# Patient Record
Sex: Female | Born: 1950 | Race: White | Hispanic: No | Marital: Married | Smoking: Former smoker
Health system: Southern US, Community
[De-identification: ages and names within clinical notes are randomized; demographics above are authoritative.]

## PROBLEM LIST (undated history)

## (undated) DIAGNOSIS — M5136 Other intervertebral disc degeneration, lumbar region: Secondary | ICD-10-CM

## (undated) DIAGNOSIS — IMO0002 Reserved for concepts with insufficient information to code with codable children: Secondary | ICD-10-CM

## (undated) DIAGNOSIS — G43909 Migraine, unspecified, not intractable, without status migrainosus: Secondary | ICD-10-CM

## (undated) DIAGNOSIS — G43709 Chronic migraine without aura, not intractable, without status migrainosus: Secondary | ICD-10-CM

## (undated) DIAGNOSIS — Z973 Presence of spectacles and contact lenses: Secondary | ICD-10-CM

## (undated) DIAGNOSIS — M1990 Unspecified osteoarthritis, unspecified site: Secondary | ICD-10-CM

## (undated) DIAGNOSIS — N840 Polyp of corpus uteri: Secondary | ICD-10-CM

## (undated) DIAGNOSIS — K5900 Constipation, unspecified: Secondary | ICD-10-CM

## (undated) DIAGNOSIS — M719 Bursopathy, unspecified: Secondary | ICD-10-CM

## (undated) DIAGNOSIS — K7689 Other specified diseases of liver: Secondary | ICD-10-CM

## (undated) DIAGNOSIS — F419 Anxiety disorder, unspecified: Secondary | ICD-10-CM

## (undated) DIAGNOSIS — I82409 Acute embolism and thrombosis of unspecified deep veins of unspecified lower extremity: Secondary | ICD-10-CM

## (undated) DIAGNOSIS — K5909 Other constipation: Secondary | ICD-10-CM

## (undated) DIAGNOSIS — S62102A Fracture of unspecified carpal bone, left wrist, initial encounter for closed fracture: Secondary | ICD-10-CM

## (undated) DIAGNOSIS — M47816 Spondylosis without myelopathy or radiculopathy, lumbar region: Secondary | ICD-10-CM

## (undated) DIAGNOSIS — S8010XA Contusion of unspecified lower leg, initial encounter: Secondary | ICD-10-CM

## (undated) DIAGNOSIS — J3089 Other allergic rhinitis: Secondary | ICD-10-CM

## (undated) DIAGNOSIS — D696 Thrombocytopenia, unspecified: Principal | ICD-10-CM

## (undated) HISTORY — PX: ANAL FISSURE REPAIR: SHX2312

## (undated) HISTORY — PX: CATARACT EXTRACTION, BILATERAL: SHX1313

## (undated) HISTORY — PX: BUNIONECTOMY: SHX129

## (undated) HISTORY — DX: Thrombocytopenia, unspecified: D69.6

## (undated) HISTORY — PX: COLONOSCOPY: SHX174

## (undated) HISTORY — DX: Bursopathy, unspecified: M71.9

## (undated) HISTORY — PX: BLEPHAROPLASTY: SUR158

---
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
## 2000-06-08 ENCOUNTER — Other Ambulatory Visit: Admission: RE | Admit: 2000-06-08 | Discharge: 2000-06-08 | Payer: Self-pay | Admitting: Obstetrics and Gynecology

## 2003-12-15 LAB — HM PAP SMEAR: HM Pap smear: NEGATIVE

## 2005-09-10 ENCOUNTER — Other Ambulatory Visit: Admission: RE | Admit: 2005-09-10 | Discharge: 2005-09-10 | Payer: Self-pay | Admitting: Obstetrics and Gynecology

## 2008-10-11 ENCOUNTER — Encounter: Admission: RE | Admit: 2008-10-11 | Discharge: 2008-10-11 | Payer: Self-pay | Admitting: Neurology

## 2009-12-14 DIAGNOSIS — K7689 Other specified diseases of liver: Secondary | ICD-10-CM

## 2009-12-14 DIAGNOSIS — M5136 Other intervertebral disc degeneration, lumbar region: Secondary | ICD-10-CM

## 2009-12-14 DIAGNOSIS — M51369 Other intervertebral disc degeneration, lumbar region without mention of lumbar back pain or lower extremity pain: Secondary | ICD-10-CM

## 2009-12-14 HISTORY — PX: ORIF WRIST FRACTURE: SHX2133

## 2009-12-14 HISTORY — DX: Other intervertebral disc degeneration, lumbar region without mention of lumbar back pain or lower extremity pain: M51.369

## 2009-12-14 HISTORY — DX: Other intervertebral disc degeneration, lumbar region: M51.36

## 2009-12-14 HISTORY — DX: Other specified diseases of liver: K76.89

## 2009-12-20 ENCOUNTER — Ambulatory Visit: Payer: Self-pay | Admitting: Diagnostic Radiology

## 2009-12-20 ENCOUNTER — Emergency Department (HOSPITAL_BASED_OUTPATIENT_CLINIC_OR_DEPARTMENT_OTHER): Admission: EM | Admit: 2009-12-20 | Discharge: 2009-12-20 | Payer: Self-pay | Admitting: Emergency Medicine

## 2010-12-05 ENCOUNTER — Emergency Department (HOSPITAL_COMMUNITY)
Admission: EM | Admit: 2010-12-05 | Discharge: 2010-12-05 | Payer: Self-pay | Source: Home / Self Care | Admitting: Emergency Medicine

## 2010-12-05 ENCOUNTER — Emergency Department (HOSPITAL_BASED_OUTPATIENT_CLINIC_OR_DEPARTMENT_OTHER)
Admission: EM | Admit: 2010-12-05 | Discharge: 2010-12-05 | Payer: Self-pay | Source: Home / Self Care | Admitting: Emergency Medicine

## 2011-03-01 LAB — URINALYSIS, ROUTINE W REFLEX MICROSCOPIC
Hgb urine dipstick: NEGATIVE
pH: 6 (ref 5.0–8.0)

## 2011-07-02 ENCOUNTER — Ambulatory Visit: Payer: Self-pay | Admitting: Hematology & Oncology

## 2011-07-30 ENCOUNTER — Other Ambulatory Visit: Payer: Self-pay | Admitting: Hematology & Oncology

## 2011-07-30 ENCOUNTER — Ambulatory Visit (HOSPITAL_BASED_OUTPATIENT_CLINIC_OR_DEPARTMENT_OTHER): Payer: BC Managed Care – PPO | Admitting: Hematology & Oncology

## 2011-07-30 DIAGNOSIS — D61818 Other pancytopenia: Secondary | ICD-10-CM

## 2011-07-30 LAB — CBC WITH DIFFERENTIAL (CANCER CENTER ONLY)
HGB: 12.3 g/dL (ref 11.6–15.9)
LYMPH#: 1.7 10*3/uL (ref 0.9–3.3)
MCH: 32.2 pg (ref 26.0–34.0)
MCHC: 35.3 g/dL (ref 32.0–36.0)
MCV: 91 fL (ref 81–101)
MONO#: 0.3 10*3/uL (ref 0.1–0.9)
NEUT%: 41.3 % (ref 39.6–80.0)
Platelets: 108 10*3/uL — ABNORMAL LOW (ref 145–400)
RDW: 12.3 % (ref 11.1–15.7)

## 2011-07-30 LAB — CHCC SATELLITE - SMEAR

## 2011-08-03 LAB — COMPREHENSIVE METABOLIC PANEL
ALT: 9 U/L (ref 0–35)
Albumin: 4 g/dL (ref 3.5–5.2)
CO2: 29 mEq/L (ref 19–32)
Calcium: 9.2 mg/dL (ref 8.4–10.5)
Chloride: 102 mEq/L (ref 96–112)
Glucose, Bld: 89 mg/dL (ref 70–99)
Potassium: 4.5 mEq/L (ref 3.5–5.3)
Sodium: 136 mEq/L (ref 135–145)
Total Protein: 7.1 g/dL (ref 6.0–8.3)

## 2011-08-03 LAB — PROTEIN ELECTROPHORESIS, SERUM
Albumin ELP: 57.6 % (ref 55.8–66.1)
Alpha-1-Globulin: 3.7 % (ref 2.9–4.9)
Alpha-2-Globulin: 10.4 % (ref 7.1–11.8)
Beta 2: 6.9 % — ABNORMAL HIGH (ref 3.2–6.5)
Gamma Globulin: 15.9 % (ref 11.1–18.8)

## 2011-08-03 LAB — IRON AND TIBC
%SAT: 29 % (ref 20–55)
Iron: 84 ug/dL (ref 42–145)

## 2011-08-03 LAB — LACTATE DEHYDROGENASE: LDH: 186 U/L (ref 94–250)

## 2011-08-03 LAB — FERRITIN: Ferritin: 189 ng/mL (ref 10–291)

## 2011-08-03 LAB — RETICULOCYTES (CHCC): Retic Ct Pct: 0.7 % (ref 0.4–2.3)

## 2011-11-26 ENCOUNTER — Other Ambulatory Visit: Payer: Self-pay | Admitting: Hematology & Oncology

## 2011-11-26 ENCOUNTER — Ambulatory Visit (HOSPITAL_BASED_OUTPATIENT_CLINIC_OR_DEPARTMENT_OTHER): Payer: BC Managed Care – PPO | Admitting: Hematology & Oncology

## 2011-11-26 ENCOUNTER — Encounter: Payer: Self-pay | Admitting: Hematology & Oncology

## 2011-11-26 ENCOUNTER — Other Ambulatory Visit (HOSPITAL_BASED_OUTPATIENT_CLINIC_OR_DEPARTMENT_OTHER): Payer: BC Managed Care – PPO | Admitting: Lab

## 2011-11-26 VITALS — BP 116/75 | HR 62 | Temp 97.2°F | Ht 61.0 in | Wt 109.0 lb

## 2011-11-26 DIAGNOSIS — D696 Thrombocytopenia, unspecified: Secondary | ICD-10-CM

## 2011-11-26 DIAGNOSIS — D72819 Decreased white blood cell count, unspecified: Secondary | ICD-10-CM

## 2011-11-26 HISTORY — DX: Thrombocytopenia, unspecified: D69.6

## 2011-11-26 LAB — CBC WITH DIFFERENTIAL (CANCER CENTER ONLY)
BASO#: 0 10*3/uL (ref 0.0–0.2)
HCT: 36.5 % (ref 34.8–46.6)
HGB: 12.7 g/dL (ref 11.6–15.9)
LYMPH#: 1.7 10*3/uL (ref 0.9–3.3)
LYMPH%: 51.9 % — ABNORMAL HIGH (ref 14.0–48.0)
MCHC: 34.8 g/dL (ref 32.0–36.0)
MCV: 91 fL (ref 81–101)
MONO#: 0.2 10*3/uL (ref 0.1–0.9)
NEUT%: 40.6 % (ref 39.6–80.0)
RDW: 12.2 % (ref 11.1–15.7)

## 2011-11-26 NOTE — Progress Notes (Signed)
This office note has been dictated.

## 2011-11-27 LAB — ANA: Anti Nuclear Antibody(ANA): NEGATIVE

## 2011-11-27 LAB — IGG, IGA, IGM
IgG (Immunoglobin G), Serum: 1020 mg/dL (ref 690–1700)
IgM, Serum: 61 mg/dL (ref 52–322)

## 2011-11-27 NOTE — Progress Notes (Signed)
CC:   Robert L. Foy Guadalajara, M.D. Rene Kocher, M.D.  DIAGNOSIS:  Thrombocytopenia/leukopenia.  Possible medication related.  CURRENT THERAPY:  Observation.  INTERIM HISTORY:  Ms. Westcott comes in for her 2nd office visit.  We initially saw her back in August.  When we saw her in August, her white cell count was 3.5.  Her platelet count was 180,000.  She had a relatively benign blood smear.  Her laboratory studies that we did on her own showed a normal LDH.  Her ferritin was okay.  She had negative monoclonal spike.  She says she has been having problems with infections.  She said that she has always had a "weak immune system."  We will go ahead and check her immunoglobulin status to see if this is a problem.  She also has a rash on her face.  This is almost in a distribution similar to lupus. We will check an ANA on her.  There has been no bleeding.  She has had no cough.  She does have some congestion.  She has not noted any joint aches or pains.  She has not noticed any joint swelling.  PHYSICAL EXAM:  General:  This is a petite white female in no obvious distress.  Vital Signs:  97.2, pulse 62, respiratory rate 18, blood pressure 116/75.  Weight is 109.  Head and Neck Exam:  Normocephalic, atraumatic skull.  There are no ocular or oral lesions.  There are no palpable cervical or supraclavicular lymph nodes.  Lungs:  Clear bilaterally.  She has no rales, wheezes, or rhonchi.  Cardiac Examination:  Regular rate and rhythm with a normal S1 and S2.  There are no murmurs, rubs, or bruits.  Abdominal Exam:  Soft with good bowel sounds.  She has no fluid wave.  There is no guarding or rebound tenderness.  There is no palpable hepatosplenomegaly.  Back Exam:  No tenderness over the spine, ribs, or hips.  There is no kyphosis or osteoporotic changes.  Extremities:  No clubbing, cyanosis, or edema. She has good range of motion of her joints.  Skin Exam:  Malar-type rash on her face.  This  is somewhat erythematous.  It is not scaly.  LABORATORY STUDIES:  White count is 3.4, hemoglobin 12.7, hematocrit 36.5, platelet count 96,000.  MCV is 91.  Peripheral smear shows a normochromic, normocytic population of red blood cells.  There are no nucleated red blood cells.  There is no rouleaux formation.  I see no target cells.  There is no schistocytes or spherocytes.  White cells appear minimally decreased in number.  She has a slight increase in lymphocytes.  There are no hypersegmented polys.  I do not see any immature myeloid cells.  There are no blasts.  Platelets are slightly decreased in number.  She has a few large platelets.  Platelets are well granulated.  IMPRESSION:  Ms. Cliffton Asters is a 60 year old white female with leukopenia/thrombocytopenia.  One might be able to make a suggestion that there could be an autoimmune process here.  Again, I am sending off an ANA on her.  We will see what her immunoglobulins are.  I suppose if she does have hypogammaglobulinemia, we could give her IVIG.  I do not see need for a bone marrow test as of yet.  I just do not see anything on her blood smear that looks suspicious.  I do want to get her back in another couple of months.  Again, we will call here with  the results of her lab work.    ______________________________ Josph Macho, M.D. PRE/MEDQ  D:  11/26/2011  T:  11/26/2011  Job:  [id number]

## 2011-12-15 LAB — HM DEXA SCAN

## 2012-02-18 ENCOUNTER — Other Ambulatory Visit (HOSPITAL_BASED_OUTPATIENT_CLINIC_OR_DEPARTMENT_OTHER): Payer: BC Managed Care – PPO | Admitting: Lab

## 2012-02-18 ENCOUNTER — Ambulatory Visit (HOSPITAL_BASED_OUTPATIENT_CLINIC_OR_DEPARTMENT_OTHER): Payer: BC Managed Care – PPO | Admitting: Hematology & Oncology

## 2012-02-18 VITALS — BP 111/69 | HR 58 | Temp 97.0°F | Ht 61.0 in | Wt 110.0 lb

## 2012-02-18 DIAGNOSIS — D696 Thrombocytopenia, unspecified: Secondary | ICD-10-CM

## 2012-02-18 DIAGNOSIS — D72819 Decreased white blood cell count, unspecified: Secondary | ICD-10-CM

## 2012-02-18 LAB — CBC WITH DIFFERENTIAL (CANCER CENTER ONLY)
BASO%: 0.5 % (ref 0.0–2.0)
HCT: 38 % (ref 34.8–46.6)
LYMPH#: 2 10*3/uL (ref 0.9–3.3)
MONO#: 0.3 10*3/uL (ref 0.1–0.9)
Platelets: 107 10*3/uL — ABNORMAL LOW (ref 145–400)
RDW: 12 % (ref 11.1–15.7)
WBC: 4.1 10*3/uL (ref 3.9–10.0)

## 2012-02-18 NOTE — Progress Notes (Signed)
This office note has been dictated.

## 2012-02-19 NOTE — Progress Notes (Signed)
CC:   Robert L. Foy Guadalajara, M.D. Rene Kocher, M.D.  DIAGNOSIS:  Thrombocytopenia/leukopenia, likely medication related.  CURRENT THERAPY:  Observation.  INTERIM HISTORY:  [name] comes in for followup.  We did have good fellowship today.  She was a close friend of my patient, [name]. [name] passed away probably a month or so ago.  It has been a tough on [name].  She and [name] were very close and [name] was very dedicated to her.  She is a little tired. She does  "cold."  So far, we have not found any obvious hematologic issue, which I am happy about.  We did do immunoglobulin studies on her.  Everything was negative.  Her IgG E level was 1020.  Her ANA was negative when we initially saw her.  There is no indication that we have to do a bone marrow test on her which I am happy about.  She has had no problems with bleeding or bruising.  She has had no change in bowel or bladder habits.  PHYSICAL EXAMINATION:  This is a petite, white female in no obvious distress.  Vital signs:  97, pulse 58, respiratory rate 14, blood pressure 111/69.  Weight is 110.  Head and neck exam shows a normocephalic, atraumatic skull.  There are no ocular or oral lesions. There are no palpable cervical or supraclavicular lymph nodes.  Lungs: Clear bilaterally.  Cardiac:  Regular rate and rhythm with a normal S1 and S2.  There are no murmurs, rubs or bruits.  Abdomen:  Soft with good bowel sounds.  There is no palpable abdominal mass.  There is no palpable hepatosplenomegaly. Back:  No clubbing, cyanosis or edema. Neurologic:  No focal neurological deficits.  Skin:  No rashes, ecchymosis or petechia.  LABORATORY STUDIES:  White cell count of 4.1, hemoglobin 13, hematocrit 38, platelet count 107. Peripheral smear does show slight increase in lymphocytes.  Lymphocytes appear mature.  There are no hypersegmented polys.  There are no nucleated red blood cells.  I see no rouleaux  formation.  Platelets are well-granulated.  There are several large platelets.  IMPRESSION:  [name] is a 61 year old white female with leukopenia/thrombocytopenia. We have been seeing her since August.  Again, her blood counts are doing well.  She has thrombocytopenia but this has not progressed.  I still do not see a need for a bone marrow test on her.  We will go ahead and plan to get her back in 4 months now.  If we do find any worsening of her white cells or platelets, then we could consider a bone marrow test.    ______________________________ Josph Macho, M.D. PRE/MEDQ  D:  02/18/2012  T:  02/19/2012  Job:  [id number]

## 2012-02-25 ENCOUNTER — Ambulatory Visit: Payer: BC Managed Care – PPO | Admitting: Hematology & Oncology

## 2012-02-25 ENCOUNTER — Other Ambulatory Visit: Payer: BC Managed Care – PPO | Admitting: Lab

## 2012-03-04 ENCOUNTER — Other Ambulatory Visit: Payer: Self-pay | Admitting: Orthopedic Surgery

## 2012-03-04 DIAGNOSIS — S52599A Other fractures of lower end of unspecified radius, initial encounter for closed fracture: Secondary | ICD-10-CM

## 2012-03-08 ENCOUNTER — Ambulatory Visit
Admission: RE | Admit: 2012-03-08 | Discharge: 2012-03-08 | Disposition: A | Discharge status: Home / Self Care | Payer: BC Managed Care – PPO | Source: Ambulatory Visit | Attending: Orthopedic Surgery | Admitting: Orthopedic Surgery

## 2012-03-08 DIAGNOSIS — S52599A Other fractures of lower end of unspecified radius, initial encounter for closed fracture: Secondary | ICD-10-CM

## 2012-04-11 ENCOUNTER — Encounter (HOSPITAL_BASED_OUTPATIENT_CLINIC_OR_DEPARTMENT_OTHER): Payer: Self-pay | Admitting: *Deleted

## 2012-04-11 NOTE — Progress Notes (Signed)
No labs needed

## 2012-04-14 ENCOUNTER — Ambulatory Visit (HOSPITAL_BASED_OUTPATIENT_CLINIC_OR_DEPARTMENT_OTHER)
Admission: RE | Admit: 2012-04-14 | Discharge: 2012-04-14 | Disposition: A | Discharge status: Home / Self Care | Payer: BC Managed Care – PPO | Source: Ambulatory Visit | Attending: Orthopedic Surgery | Admitting: Orthopedic Surgery

## 2012-04-14 ENCOUNTER — Encounter (HOSPITAL_BASED_OUTPATIENT_CLINIC_OR_DEPARTMENT_OTHER): Payer: Self-pay | Admitting: *Deleted

## 2012-04-14 ENCOUNTER — Encounter (HOSPITAL_BASED_OUTPATIENT_CLINIC_OR_DEPARTMENT_OTHER): Payer: Self-pay | Admitting: Anesthesiology

## 2012-04-14 ENCOUNTER — Encounter (HOSPITAL_BASED_OUTPATIENT_CLINIC_OR_DEPARTMENT_OTHER)
Admission: RE | Disposition: A | Discharge status: Home / Self Care | Payer: Self-pay | Source: Ambulatory Visit | Attending: Orthopedic Surgery

## 2012-04-14 ENCOUNTER — Ambulatory Visit (HOSPITAL_BASED_OUTPATIENT_CLINIC_OR_DEPARTMENT_OTHER): Payer: BC Managed Care – PPO | Admitting: Anesthesiology

## 2012-04-14 DIAGNOSIS — T8489XA Other specified complication of internal orthopedic prosthetic devices, implants and grafts, initial encounter: Secondary | ICD-10-CM | POA: Insufficient documentation

## 2012-04-14 DIAGNOSIS — Y831 Surgical operation with implant of artificial internal device as the cause of abnormal reaction of the patient, or of later complication, without mention of misadventure at the time of the procedure: Secondary | ICD-10-CM | POA: Insufficient documentation

## 2012-04-14 DIAGNOSIS — S52502A Unspecified fracture of the lower end of left radius, initial encounter for closed fracture: Secondary | ICD-10-CM

## 2012-04-14 DIAGNOSIS — R51 Headache: Secondary | ICD-10-CM | POA: Insufficient documentation

## 2012-04-14 DIAGNOSIS — F411 Generalized anxiety disorder: Secondary | ICD-10-CM | POA: Insufficient documentation

## 2012-04-14 DIAGNOSIS — F329 Major depressive disorder, single episode, unspecified: Secondary | ICD-10-CM | POA: Insufficient documentation

## 2012-04-14 DIAGNOSIS — F3289 Other specified depressive episodes: Secondary | ICD-10-CM | POA: Insufficient documentation

## 2012-04-14 HISTORY — DX: Unspecified osteoarthritis, unspecified site: M19.90

## 2012-04-14 HISTORY — PX: HARDWARE REMOVAL: SHX979

## 2012-04-14 HISTORY — DX: Anxiety disorder, unspecified: F41.9

## 2012-04-14 SURGERY — REMOVAL, HARDWARE
Anesthesia: General | Site: Wrist | Laterality: Left | Wound class: Clean

## 2012-04-14 MED ORDER — ONDANSETRON HCL 4 MG/2ML IJ SOLN
INTRAMUSCULAR | Status: DC | PRN
  Administered 2012-04-14: 4 mg via INTRAVENOUS

## 2012-04-14 MED ORDER — GLYCOPYRROLATE 0.2 MG/ML IJ SOLN
INTRAMUSCULAR | Status: DC | PRN
  Administered 2012-04-14: 0.2 mg via INTRAVENOUS

## 2012-04-14 MED ORDER — BUPIVACAINE HCL 0.25 % IJ SOLN
INTRAMUSCULAR | Status: DC | PRN
  Administered 2012-04-14: 10 mL

## 2012-04-14 MED ORDER — LACTATED RINGERS IV SOLN
INTRAVENOUS | Status: DC
  Administered 2012-04-14: 11:00:00 via INTRAVENOUS

## 2012-04-14 MED ORDER — CHLORHEXIDINE GLUCONATE 4 % EX LIQD: 60.0000 mL | Freq: Once | CUTANEOUS | Status: DC

## 2012-04-14 MED ORDER — HYDROMORPHONE HCL PF 1 MG/ML IJ SOLN
0.2500 mg | INTRAMUSCULAR | Status: DC | PRN
  Administered 2012-04-14: 0.125 mg via INTRAVENOUS
  Administered 2012-04-14: 0.25 mg via INTRAVENOUS
  Administered 2012-04-14: 0.125 mg via INTRAVENOUS

## 2012-04-14 MED ORDER — HYDROCODONE-ACETAMINOPHEN 5-325 MG PO TABS
1.0000 | ORAL_TABLET | Freq: Once | ORAL | Status: AC | PRN
  Administered 2012-04-14: 1 via ORAL

## 2012-04-14 MED ORDER — MIDAZOLAM HCL 5 MG/5ML IJ SOLN
INTRAMUSCULAR | Status: DC | PRN
  Administered 2012-04-14: 1 mg via INTRAVENOUS

## 2012-04-14 MED ORDER — DOCUSATE SODIUM 100 MG PO CAPS: 100.0000 mg | ORAL_CAPSULE | Freq: Two times a day (BID) | ORAL | Status: AC

## 2012-04-14 MED ORDER — PROPOFOL 10 MG/ML IV EMUL
INTRAVENOUS | Status: DC | PRN
  Administered 2012-04-14: 150 mg via INTRAVENOUS

## 2012-04-14 MED ORDER — HYDROCODONE-ACETAMINOPHEN 5-500 MG PO TABS: 1.0000 | ORAL_TABLET | Freq: Four times a day (QID) | ORAL | Status: AC | PRN

## 2012-04-14 MED ORDER — LIDOCAINE HCL (CARDIAC) 20 MG/ML IV SOLN
INTRAVENOUS | Status: DC | PRN
  Administered 2012-04-14: 40 mg via INTRAVENOUS

## 2012-04-14 MED ORDER — CEFAZOLIN SODIUM 1-5 GM-% IV SOLN
1.0000 g | INTRAVENOUS | Status: AC
  Administered 2012-04-14: 1 g via INTRAVENOUS

## 2012-04-14 MED ORDER — EPHEDRINE SULFATE 50 MG/ML IJ SOLN
INTRAMUSCULAR | Status: DC | PRN
  Administered 2012-04-14: 10 mg via INTRAVENOUS

## 2012-04-14 MED ORDER — FENTANYL CITRATE 0.05 MG/ML IJ SOLN
INTRAMUSCULAR | Status: DC | PRN
  Administered 2012-04-14: 50 ug via INTRAVENOUS

## 2012-04-14 MED ORDER — ONDANSETRON HCL 4 MG/2ML IJ SOLN
4.0000 mg | Freq: Four times a day (QID) | INTRAMUSCULAR | Status: DC | PRN
Start: 2012-04-14 — End: 2012-04-14

## 2012-04-14 SURGICAL SUPPLY — 51 items
APL SKNCLS STERI-STRIP NONHPOA (GAUZE/BANDAGES/DRESSINGS)
BANDAGE ELASTIC 3 VELCRO ST LF (GAUZE/BANDAGES/DRESSINGS) ×2 IMPLANT
BANDAGE ELASTIC 4 VELCRO ST LF (GAUZE/BANDAGES/DRESSINGS) IMPLANT
BANDAGE GAUZE ELAST BULKY 4 IN (GAUZE/BANDAGES/DRESSINGS) ×2 IMPLANT
BENZOIN TINCTURE PRP APPL 2/3 (GAUZE/BANDAGES/DRESSINGS) IMPLANT
BLADE SURG 15 STRL LF DISP TIS (BLADE) ×1 IMPLANT
BLADE SURG 15 STRL SS (BLADE) ×2
BNDG CMPR 9X4 STRL LF SNTH (GAUZE/BANDAGES/DRESSINGS) ×1
BNDG ESMARK 4X9 LF (GAUZE/BANDAGES/DRESSINGS) ×2 IMPLANT
BRUSH SCRUB EZ PLAIN DRY (MISCELLANEOUS) ×2 IMPLANT
CORDS BIPOLAR (ELECTRODE) ×2 IMPLANT
COVER MAYO STAND STRL (DRAPES) ×2 IMPLANT
COVER TABLE BACK 60X90 (DRAPES) ×2 IMPLANT
CUFF TOURNIQUET SINGLE 18IN (TOURNIQUET CUFF) ×1 IMPLANT
DECANTER SPIKE VIAL GLASS SM (MISCELLANEOUS) IMPLANT
DRAPE EXTREMITY T 121X128X90 (DRAPE) ×2 IMPLANT
DRAPE OEC MINIVIEW 54X84 (DRAPES) ×1 IMPLANT
DRAPE SURG 17X23 STRL (DRAPES) ×2 IMPLANT
DRSG EMULSION OIL 3X3 NADH (GAUZE/BANDAGES/DRESSINGS) ×2 IMPLANT
GLOVE BIOGEL PI IND STRL 8.5 (GLOVE) ×1 IMPLANT
GLOVE BIOGEL PI INDICATOR 8.5 (GLOVE) ×1
GLOVE SURG ORTHO 8.0 STRL STRW (GLOVE) ×2 IMPLANT
GOWN PREVENTION PLUS XLARGE (GOWN DISPOSABLE) ×2 IMPLANT
GOWN PREVENTION PLUS XXLARGE (GOWN DISPOSABLE) ×3 IMPLANT
NDL HYPO 25X1 1.5 SAFETY (NEEDLE) ×1 IMPLANT
NEEDLE HYPO 25X1 1.5 SAFETY (NEEDLE) ×2 IMPLANT
NS IRRIG 1000ML POUR BTL (IV SOLUTION) ×2 IMPLANT
PACK BASIN DAY SURGERY FS (CUSTOM PROCEDURE TRAY) ×2 IMPLANT
PAD CAST 3X4 CTTN HI CHSV (CAST SUPPLIES) ×1 IMPLANT
PADDING CAST ABS 3INX4YD NS (CAST SUPPLIES) ×1
PADDING CAST ABS 4INX4YD NS (CAST SUPPLIES) ×1
PADDING CAST ABS COTTON 3X4 (CAST SUPPLIES) IMPLANT
PADDING CAST ABS COTTON 4X4 ST (CAST SUPPLIES) ×1 IMPLANT
PADDING CAST COTTON 3X4 STRL (CAST SUPPLIES) ×2
SPLINT FIBERGLASS 3X35 (CAST SUPPLIES) ×1 IMPLANT
SPLINT PLASTER CAST XFAST 3X15 (CAST SUPPLIES) IMPLANT
SPLINT PLASTER XTRA FASTSET 3X (CAST SUPPLIES)
SPONGE GAUZE 4X4 12PLY (GAUZE/BANDAGES/DRESSINGS) ×2 IMPLANT
STOCKINETTE 4X48 STRL (DRAPES) ×2 IMPLANT
STRIP CLOSURE SKIN 1/2X4 (GAUZE/BANDAGES/DRESSINGS) IMPLANT
SUT MNCRL AB 3-0 PS2 18 (SUTURE) IMPLANT
SUT MNCRL AB 4-0 PS2 18 (SUTURE) ×1 IMPLANT
SUT PROLENE 4 0 PS 2 18 (SUTURE) IMPLANT
SUT VIC AB 2-0 SH 27 (SUTURE) ×2
SUT VIC AB 2-0 SH 27XBRD (SUTURE) IMPLANT
SYR BULB 3OZ (MISCELLANEOUS) ×2 IMPLANT
SYR CONTROL 10ML LL (SYRINGE) ×2 IMPLANT
TOWEL OR 17X24 6PK STRL BLUE (TOWEL DISPOSABLE) ×2 IMPLANT
TRAY DSU PREP LF (CUSTOM PROCEDURE TRAY) ×2 IMPLANT
UNDERPAD 30X30 INCONTINENT (UNDERPADS AND DIAPERS) ×2 IMPLANT
WATER STERILE IRR 1000ML POUR (IV SOLUTION) ×2 IMPLANT

## 2012-04-14 NOTE — Anesthesia Postprocedure Evaluation (Signed)
Anesthesia Post Note  Patient: [name]  Procedure(s) Performed: Procedure(s) (LRB): HARDWARE REMOVAL (Left)  Anesthesia type: General  Patient location: PACU  Post pain: Pain level controlled and Adequate analgesia  Post assessment: Post-op Vital signs reviewed, Patient's Cardiovascular Status Stable, Respiratory Function Stable, Patent Airway and Pain level controlled  Last Vitals:  Filed Vitals:   04/14/12 1530  BP: 123/64  Pulse: 72  Temp:   Resp: 12    Post vital signs: Reviewed and stable  Level of consciousness: awake, alert  and oriented  Complications: No apparent anesthesia complications

## 2012-04-14 NOTE — Anesthesia Procedure Notes (Signed)
Procedure Name: LMA Insertion Performed by: York Grice Pre-anesthesia Checklist: Patient identified, Timeout performed, Emergency Drugs available, Suction available and Patient being monitored Patient Re-evaluated:Patient Re-evaluated prior to inductionOxygen Delivery Method: Circle system utilized and Simple face mask Preoxygenation: Pre-oxygenation with 100% oxygen Intubation Type: IV induction Ventilation: Mask ventilation without difficulty LMA: LMA inserted LMA Size: 3.0 Number of attempts: 1 Placement Confirmation: breath sounds checked- equal and bilateral and positive ETCO2 Tube secured with: Tape Dental Injury: Teeth and Oropharynx as per pre-operative assessment

## 2012-04-14 NOTE — H&P (Signed)
[name] is an 61 y.o. female.   Chief Complaint: symptomatic hardware left wrist HPI: pt sustained closed distal radius fracture Had orif of the distal radius and now over one year with symptomatic hardware Pt elects removal Pt seen and evaluated in office.  Past Medical History  Diagnosis Date  . Thrombocytopenia 11/26/2011  . Depression   . Anxiety   . Arthritis   . Osteoporosis   . Headache     Past Surgical History  Procedure Date  . Bunionectomy     both feet  . Anal fissure repair   . Wrist arthroplasty 2011    left   . Colonoscopy     History reviewed. No pertinent family history. Social History:  reports that she quit smoking about 21 years ago. She does not have any smokeless tobacco history on file. She reports that she drinks alcohol. She reports that she does not use illicit drugs.  Allergies:  Allergies  Allergen Reactions  . Corticosteroids Anxiety  . Cortisone   . Latex Rash    Medications Prior to Admission  Medication Sig Dispense Refill  . alendronate (FOSAMAX) 70 MG tablet Take 70 mg by mouth Once a week.      . cholecalciferol (VITAMIN D) 1000 UNITS tablet Take 1,000 Units by mouth daily.      . citalopram (CELEXA) 40 MG tablet Take 40 mg by mouth Daily.      . clonazePAM (KLONOPIN) 1 MG tablet Take 1 mg by mouth Daily.      . frovatriptan (FROVA) 2.5 MG tablet Take 2.5 mg by mouth as needed. If recurs, may repeat after 2 hours. Max of 3 tabs in 24 hours.      . promethazine (PHENERGAN) 12.5 MG tablet Take 12.5 mg by mouth every 6 (six) hours as needed.          No results found for this or any previous visit (from the past 48 hour(s)). No results found.  No recent illnesses or hospitalizations  Blood pressure 125/78, pulse 51, temperature 98.1 F (36.7 C), temperature source Oral, resp. rate 16, SpO2 100.00%. General Appearance:  Alert, cooperative, no distress, appears stated age  Head:  Normocephalic, without obvious abnormality,  atraumatic  Eyes:  Pupils equal, conjunctiva/corneas clear,         Throat: Lips, mucosa, and tongue normal; teeth and gums normal  Neck: No visible masses     Lungs:   respirations unlabored  Chest Wall:  No tenderness or deformity  Heart:  Regular rate and rhythm,  Abdomen:   Soft, non-tender,         Extremities: Left wrist incision well healed good range of motion of wrist fingers warm well perfused  Pulses: 2+ and symmetric  Skin: Skin color, texture, turgor normal, no rashes or lesions     Neurologic: Normal    Assessment/Plan Left wrist retained hardware  Left wrist deep implant removal  R/B/A DISCUSSED WITH PT IN OFFICE.  PT VOICED UNDERSTANDING OF PLAN CONSENT SIGNED DAY OF SURGERY PT SEEN AND EXAMINED PRIOR TO OPERATIVE PROCEDURE/DAY OF SURGERY SITE MARKED. QUESTIONS ANSWERED WILL GO HOME FOLLOWING SURGERY  Sharma Covert 04/14/2012, 1:02 PM

## 2012-04-14 NOTE — Discharge Instructions (Signed)
KEEP BANDAGE CLEAN AND DRY °CALL OFFICE FOR F/U APPT [phone] °KEEP HAND ELEVATED ABOVE HEART °OK TO APPLY ICE TO OPERATIVE AREA °CONTACT OFFICE IF ANY WORSENING PAIN OR CONCERNS. ° ° ° °Post Anesthesia Home Care Instructions ° °Activity: °Get plenty of rest for the remainder of the day. A responsible adult should stay with you for 24 hours following the procedure.  °For the next 24 hours, DO NOT: °-Drive a car °-Operate machinery °-Drink alcoholic beverages °-Take any medication unless instructed by your physician °-Make any legal decisions or sign important papers. ° °Meals: °Start with liquid foods such as gelatin or soup. Progress to regular foods as tolerated. Avoid greasy, spicy, heavy foods. If nausea and/or vomiting occur, drink only clear liquids until the nausea and/or vomiting subsides. Call your physician if vomiting continues. ° °Special Instructions/Symptoms: °Your throat may feel dry or sore from the anesthesia or the breathing tube placed in your throat during surgery. If this causes discomfort, gargle with warm salt water. The discomfort should disappear within 24 hours. ° °

## 2012-04-14 NOTE — Anesthesia Preprocedure Evaluation (Signed)
Anesthesia Evaluation  Patient identified by MRN, date of birth, ID band Patient awake    Reviewed: Allergy & Precautions, H&P , NPO status , Patient's Chart, lab work & pertinent test results  Airway Mallampati: II  Neck ROM: full    Dental   Pulmonary          Cardiovascular     Neuro/Psych  Headaches, PSYCHIATRIC DISORDERS Anxiety Depression    GI/Hepatic   Endo/Other    Renal/GU      Musculoskeletal   Abdominal   Peds  Hematology   Anesthesia Other Findings   Reproductive/Obstetrics                           Anesthesia Physical Anesthesia Plan  ASA: II  Anesthesia Plan: General   Post-op Pain Management:    Induction: Intravenous  Airway Management Planned: LMA  Additional Equipment:   Intra-op Plan:   Post-operative Plan:   Informed Consent: I have reviewed the patients History and Physical, chart, labs and discussed the procedure including the risks, benefits and alternatives for the proposed anesthesia with the patient or authorized representative who has indicated his/her understanding and acceptance.     Plan Discussed with: CRNA and Surgeon  Anesthesia Plan Comments:         Anesthesia Quick Evaluation

## 2012-04-14 NOTE — Transfer of Care (Signed)
Immediate Anesthesia Transfer of Care Note  Patient: [name]  Procedure(s) Performed: Procedure(s) (LRB): HARDWARE REMOVAL (Left)  Patient Location: PACU  Anesthesia Type: General  Level of Consciousness: awake  Airway & Oxygen Therapy: Patient Spontanous Breathing and Patient connected to face mask oxygen  Post-op Assessment: Report given to PACU RN and Post -op Vital signs reviewed and stable  Post vital signs: Reviewed and stable  Complications: No apparent anesthesia complications

## 2012-04-14 NOTE — Brief Op Note (Signed)
04/14/2012  2:35 PM  PATIENT:  [name]  61 y.o. female  PRE-OPERATIVE DIAGNOSIS:  left wrist retained hardware  POST-OPERATIVE DIAGNOSIS:  left wrist retained hardware  PROCEDURE:  Procedure(s) (LRB): HARDWARE REMOVAL (Left)  SURGEON:  Surgeon(s) and Role:    * Sharma Covert, MD - Primary  PHYSICIAN ASSISTANT:   ASSISTANTS: none   ANESTHESIA:   general  EBL:  Total I/O In: 1400 [I.V.:1400] Out: -   BLOOD ADMINISTERED:none  DRAINS: none   LOCAL MEDICATIONS USED:  MARCAINE    and NONE  SPECIMEN:  No Specimen  DISPOSITION OF SPECIMEN:  N/A  COUNTS:  YES  TOURNIQUET:  * Missing tourniquet times found for documented tourniquets in log:  [id number] *  DICTATION: job id [id number]  PLAN OF CARE: Discharge to home after PACU  PATIENT DISPOSITION:  PACU - hemodynamically stable.   Delay start of Pharmacological VTE agent (>24hrs) due to surgical blood loss or risk of bleeding: not applicable

## 2012-04-15 ENCOUNTER — Encounter (HOSPITAL_BASED_OUTPATIENT_CLINIC_OR_DEPARTMENT_OTHER): Payer: Self-pay | Admitting: Orthopedic Surgery

## 2012-04-15 NOTE — Op Note (Signed)
[name], [name]               ACCOUNT NO.:  [id number]  MEDICAL RECORD NO.:  [id number]  LOCATION:                                 FACILITY:  PHYSICIAN:  Madelynn Done, MD       DATE OF BIRTH:  DATE OF PROCEDURE:  04/14/2012 DATE OF DISCHARGE:                              OPERATIVE REPORT   PREOPERATIVE DIAGNOSIS:  Left wrist intra-articular distal radius fracture with retained hardware.  POSTOPERATIVE DIAGNOSIS:  Left wrist intra-articular distal radius fracture with retained hardware.  ATTENDING PHYSICIAN:  Madelynn Done, MD, who scrubbed and present for the entire procedure.  ASSISTANT SURGEON:  None.  ANESTHESIA:  General via LMA.  SURGICAL PROCEDURES: 1. Removal of the deep hardware, left wrist, plates and screws. 2. Left wrist flexor pollicis longus tenolysis and finger flexor     tenolysis. 3. Radiographs, 2 views, left wrist.  SURGICAL INDICATION:  [name] is a 61 year old, right-hand-dominant female who sustained a distal radius fracture greater than a year ago. The patient is having symptomatic hardware and elected to undergo the above procedure.  Risks, benefits, and alternatives were discussed in detail with the patient and signed informed consent was obtained.  Risks include, but are not limited to, bleeding, infection, damage to nearby nerves, arteries, or tendons, loss motion of the wrist and digits, and need for further surgical intervention.  DESCRIPTION OF THE PROCEDURE:  The patient was appropriately identified in the preop holding area, a mark with a permanent marker was made on the left wrist to indicate correct operative site.  The patient was then brought back to the operating room, placed supine on the anesthesia room table where general anesthesia was administered.  The patient tolerated this well.  A well-padded tourniquet was then placed on the left brachium and sealed with 1000 drape.  Left upper extremity was then prepped  and draped in normal sterile fashion.  Time-out was called. Correct site was identified, and procedure was then begun.  Attention was then turned to the left wrist where a longitudinal incision was made directly over the previous incision.  Dissection was then carried down through the subcutaneous tissue.  The tourniquet was then insufflated. The FCR sheath was then opened both proximally and distally once again. The pronator quadratus was elevated.  The patient did have adherence of the flexor pollicis longus to the pronator quadratus and plate.  Careful tenolysis was then carried out of the FPL, fraying the FPL from the surface of the pronator quadratus and plate region.  After this was carried out, the plate was then identified.  The screws were then removed and then the plate was then removed from the distal radius surface.  The wound was then thoroughly irrigated.  The pronator quadratus interval was then closed with a 2-0 Vicryl.  The final radiographs were then obtained.  Subcutaneous tissues were closed with 4- 0 Monocryl, and the skin was closed with a running 4-0 Prolene and subcuticular suture.  Benzoin, Steri-Strips were applied.  Sterile compressive bandage was then applied.  A 10 mL of 0.25% Marcaine was infiltrated locally.  The patient was then placed in a well-padded volar  splint, extubated, and taken to recovery room in good condition.  Radiographs, 2 views, of the wrist did show removal of the internal fixation with good restoration of the radial height, inclination, and tilt.  POSTOPERATIVE PLAN:  The patient was discharged home.  I will see her back in the office in approximately 2 weeks for wound check, suture removal, x-rays, and then transition to short-arm wrist bracing, gradually use and activity.     Madelynn Done, MD     FWO/MEDQ  D:  04/14/2012  T:  04/14/2012  Job:  [id number]

## 2012-06-30 ENCOUNTER — Ambulatory Visit (HOSPITAL_BASED_OUTPATIENT_CLINIC_OR_DEPARTMENT_OTHER): Payer: BC Managed Care – PPO | Admitting: Hematology & Oncology

## 2012-06-30 ENCOUNTER — Encounter: Payer: Self-pay | Admitting: Hematology & Oncology

## 2012-06-30 ENCOUNTER — Other Ambulatory Visit (HOSPITAL_BASED_OUTPATIENT_CLINIC_OR_DEPARTMENT_OTHER): Payer: BC Managed Care – PPO | Admitting: Lab

## 2012-06-30 VITALS — BP 102/67 | HR 60 | Temp 97.4°F | Ht 61.0 in | Wt 110.0 lb

## 2012-06-30 DIAGNOSIS — D696 Thrombocytopenia, unspecified: Secondary | ICD-10-CM

## 2012-06-30 DIAGNOSIS — D72819 Decreased white blood cell count, unspecified: Secondary | ICD-10-CM

## 2012-06-30 LAB — CBC WITH DIFFERENTIAL (CANCER CENTER ONLY)
Eosinophils Absolute: 0 10*3/uL (ref 0.0–0.5)
LYMPH%: 50.2 % — ABNORMAL HIGH (ref 14.0–48.0)
MCV: 92 fL (ref 81–101)
MONO#: 0.2 10*3/uL (ref 0.1–0.9)
NEUT#: 1.2 10*3/uL — ABNORMAL LOW (ref 1.5–6.5)
Platelets: 113 10*3/uL — ABNORMAL LOW (ref 145–400)
RBC: 4.01 10*6/uL (ref 3.70–5.32)
WBC: 3 10*3/uL — ABNORMAL LOW (ref 3.9–10.0)

## 2012-06-30 LAB — CHCC SATELLITE - SMEAR

## 2012-06-30 NOTE — Progress Notes (Signed)
This office note has been dictated.

## 2012-07-01 NOTE — Progress Notes (Signed)
CC:   Robert L. Foy Guadalajara, M.D. Rene Kocher, M.D.  DIAGNOSIS:  Chronic leukopenia/thrombocytopenia.  CURRENT THERAPY:  Observation.  INTERIM HISTORY:  Ms. Ewing comes in for followup.  She is doing okay. We last saw her back in March.  She had been on vacation with her family.  She has had no problems with fatigue or weakness.  There has been no bleeding.  She had no problems with infections.  She has not noted any rashes.  There has been no change in bowel or bladder habits.  PHYSICAL EXAMINATION:  General:  This is a petite, white female in no obvious distress.  Vital signs:  Show a temperature of 97.4, pulse 60, respiratory rate 20, blood pressure 102/67.  Weight is 110.  Head and neck:  Normocephalic, atraumatic skull.  There are no ocular or oral lesions.  There are no palpable cervical or supraclavicular lymph nodes. Lungs:  Clear bilaterally.  Cardiac exam:  Regular rate and rhythm with a normal S1 and S2.  There are no murmurs, rubs or bruits.  Abdomen: Soft with good bowel sounds.  There is no fluid wave.  There is no abdominal mass.  There is no palpable hepatosplenomegaly.  Back:  No tenderness over the spine, ribs, or hips.  Extremities:  Shows no clubbing, cyanosis or edema.  She has good range of motion of her joints.  Neurologic:  No focal neurological deficits.  Skin:  No rashes, ecchymosis or petechia.  LABORATORY STUDIES:  White cell count 3, hemoglobin 12.8, hematocrit 36.7, platelet count 113.  Peripheral blood smear shows a normochromic, normocytic population of red blood cells.  There is no nucleated red blood cells.  I see no rouleaux formation.  White cells are with slight increase in lymphocytes.  She has no atypical lymphocytes.  There are no hypersegmented polys.  I see no immature myeloid cells.  Platelets are mildly decreased in number.  Platelets are well granulated.  IMPRESSION:  Ms. Savard is a 61 year old white female  with leukopenia/thrombocytopenia.  We have been following her for a year. Her lab work has been pretty much stable during this time.  I have sent off autoimmune studies.  Autoimmune studies so far have been negative.  Her ANA was negative.  She does not have a monoclonal spike in her serum.  We will continue to follow her.  I will plan to see her back in 4 more months.   ______________________________ Josph Macho, M.D. PRE/MEDQ  D:  06/30/2012  T:  07/01/2012  Job:  [id number]

## 2012-10-20 ENCOUNTER — Other Ambulatory Visit (HOSPITAL_BASED_OUTPATIENT_CLINIC_OR_DEPARTMENT_OTHER): Payer: BC Managed Care – PPO | Admitting: Lab

## 2012-10-20 ENCOUNTER — Ambulatory Visit (HOSPITAL_BASED_OUTPATIENT_CLINIC_OR_DEPARTMENT_OTHER): Payer: BC Managed Care – PPO | Admitting: Hematology & Oncology

## 2012-10-20 VITALS — BP 102/61 | HR 53 | Temp 97.9°F | Resp 18 | Ht 61.0 in | Wt 112.0 lb

## 2012-10-20 DIAGNOSIS — D696 Thrombocytopenia, unspecified: Secondary | ICD-10-CM

## 2012-10-20 DIAGNOSIS — D72819 Decreased white blood cell count, unspecified: Secondary | ICD-10-CM

## 2012-10-20 LAB — CBC WITH DIFFERENTIAL (CANCER CENTER ONLY)
BASO#: 0 10*3/uL (ref 0.0–0.2)
Eosinophils Absolute: 0 10*3/uL (ref 0.0–0.5)
HCT: 36.3 % (ref 34.8–46.6)
HGB: 12.6 g/dL (ref 11.6–15.9)
LYMPH#: 1.5 10*3/uL (ref 0.9–3.3)
MONO#: 0.3 10*3/uL (ref 0.1–0.9)
NEUT%: 39.6 % (ref 39.6–80.0)
WBC: 3 10*3/uL — ABNORMAL LOW (ref 3.9–10.0)

## 2012-10-20 NOTE — Progress Notes (Signed)
This office note has been dictated.

## 2012-10-21 NOTE — Progress Notes (Signed)
CC:   [name], M.D.  DIAGNOSIS:  Chronic leukopenia/thrombocytopenia.  CURRENT THERAPY:  Observation.  INTERIM HISTORY:  [name] comes in for followup.  She has been doing well.  We last saw her back in July.  She had no problems since we have last seen her.  She had a good summer.  There have been no problems with bleeding or bruising.  She has had no nausea or vomiting.  There has been no cough or shortness of breath. She has had no rashes.  She has had no problems with weight loss or weight gain.  PHYSICAL EXAMINATION:  General:  This is a petite white female, in no obvious distress.  Vital signs:  Temperature of 97.9, pulse 53, respiratory rate 18, blood pressure 102/61.  Weight is 112.  Head and neck:  Normocephalic, atraumatic skull.  There are no ocular or oral lesions.  There are no palpable cervical or supraclavicular lymph nodes. Lungs:  Clear bilaterally.  Cardiac:  Regular rate and rhythm, with a normal S1, S2.  There are no murmurs, rubs, or bruits.  Abdomen:  Soft, with good bowel sounds.  There is no palpable abdominal mass.  There is no palpable hepatosplenomegaly.  Extremities:  Show no clubbing, cyanosis, or edema.  Neurological:  Shows no focal neurological deficits.  LABORATORY STUDIES:  White cell count is 3, hemoglobin 12.6, hematocrit 36.3, platelet count 99,000.  MCV is 93.  White cell differential is 40 segs, 49 lymphs, 10 monos.  Peripheral smear shows no immature myeloid or lymphoid cells.  Platelets are well granulated.  She may have a couple of large platelets.  She has no schistocytes.  There are no nucleated red blood cells.  There is no rouleaux formation or spherocytes.  IMPRESSION:  [name] is a charming 61 year old white female with leukopenia/thrombocytopenia.  We have been following her now for close to a year.  Her counts really have not changed much. Her blood smear is reassuring.  I do not see need for a bone marrow  test.  We will go ahead and plan to get her back in in 4 months or so.  We will get her through the wintertime.  I do not see a need for any additional lab work on her.   ______________________________ Josph Macho, M.D. PRE/MEDQ  D:  10/20/2012  T:  10/21/2012  Job:  [id number]

## 2013-01-31 ENCOUNTER — Telehealth: Payer: Self-pay | Admitting: Hematology & Oncology

## 2013-01-31 NOTE — Telephone Encounter (Signed)
Informed patient of appointment time change on 02/16/13 and that they are now scheduled with Eunice Blase.  Asked patient to call office and confirm that this time will be ok.

## 2013-02-14 ENCOUNTER — Telehealth: Payer: Self-pay | Admitting: Hematology & Oncology

## 2013-02-14 NOTE — Telephone Encounter (Signed)
Per RN to cx 02/16/13 apt due to no provider and to resch for 02/15/13.  i called and gave patient 02/15/13 apt.  She is aware of apt change and states she will be here

## 2013-02-15 ENCOUNTER — Ambulatory Visit (HOSPITAL_BASED_OUTPATIENT_CLINIC_OR_DEPARTMENT_OTHER): Payer: BC Managed Care – PPO | Admitting: Lab

## 2013-02-15 ENCOUNTER — Ambulatory Visit (HOSPITAL_BASED_OUTPATIENT_CLINIC_OR_DEPARTMENT_OTHER): Payer: BC Managed Care – PPO | Admitting: Oncology

## 2013-02-15 VITALS — BP 108/56 | HR 55 | Temp 97.4°F | Resp 16 | Ht 61.0 in | Wt 112.0 lb

## 2013-02-15 DIAGNOSIS — D696 Thrombocytopenia, unspecified: Secondary | ICD-10-CM

## 2013-02-15 DIAGNOSIS — D72819 Decreased white blood cell count, unspecified: Secondary | ICD-10-CM

## 2013-02-15 DIAGNOSIS — D708 Other neutropenia: Secondary | ICD-10-CM

## 2013-02-15 LAB — CBC WITH DIFFERENTIAL (CANCER CENTER ONLY)
BASO#: 0 10*3/uL (ref 0.0–0.2)
Eosinophils Absolute: 0 10*3/uL (ref 0.0–0.5)
HGB: 12.3 g/dL (ref 11.6–15.9)
LYMPH%: 43.4 % (ref 14.0–48.0)
MCH: 31.5 pg (ref 26.0–34.0)
MCV: 92 fL (ref 81–101)
MONO%: 6.8 % (ref 0.0–13.0)
RBC: 3.9 10*6/uL (ref 3.70–5.32)

## 2013-02-15 NOTE — Progress Notes (Signed)
   [facility]    OFFICE PROGRESS NOTE   INTERVAL HISTORY:   She returns as scheduled. She complains of a sore throat. This has been an intermittent problem for years. She has intermittent migraine headaches and reports a "sinus "headache today. No other complaint.  Objective:  Vital signs in last 24 hours:  Blood pressure 108/56, pulse 55, temperature 97.4 F (36.3 C), temperature source Oral, resp. rate 16, height 5\' 1"  (1.549 m), weight 112 lb (50.803 kg).    HEENT: Pharynx without erythema or exudate. Lymphatics: No cervical or supraclavicular nodes. Prominent bilateral axillary fat pads versus "shotty "nodes Resp: Lungs clear bilaterally Cardio: Regular rate and rhythm GI: No hepatosplenomegaly Vascular: No leg edema   Lab Results:  Lab Results  Component Value Date   WBC 3.7* 02/15/2013   HGB 12.3 02/15/2013   HCT 36.0 02/15/2013   MCV 92 02/15/2013   PLT 99* 02/15/2013   ANC 1.8 Absolute lymphocyte count 1.6    Medications: I have reviewed the patient's current medications.  Assessment/Plan:  1. 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 The CBC has not changed significantly over the past year. She reports a history of thrombocytopenia dating to the 62s when she was evaluated by Dr. Durward Fortes. 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 The mild leukopenia may be a normal variant or related to an autoimmune condition.  She will contact us for spontaneous bleeding or bruising. Ms. Scallan will return for an office visit and CBC in 8 months.   Thornton Papas, MD  02/15/2013  3:45 PM

## 2013-02-16 ENCOUNTER — Other Ambulatory Visit: Payer: BC Managed Care – PPO | Admitting: Lab

## 2013-02-16 ENCOUNTER — Ambulatory Visit: Payer: BC Managed Care – PPO | Admitting: Hematology & Oncology

## 2013-02-16 ENCOUNTER — Ambulatory Visit: Payer: BC Managed Care – PPO | Admitting: Medical

## 2013-04-25 ENCOUNTER — Encounter: Payer: Self-pay | Admitting: Obstetrics and Gynecology

## 2013-04-25 ENCOUNTER — Ambulatory Visit (INDEPENDENT_AMBULATORY_CARE_PROVIDER_SITE_OTHER): Payer: BC Managed Care – PPO | Admitting: Obstetrics and Gynecology

## 2013-04-25 VITALS — BP 100/62 | Ht 60.75 in | Wt 109.0 lb

## 2013-04-25 DIAGNOSIS — Z01419 Encounter for gynecological examination (general) (routine) without abnormal findings: Secondary | ICD-10-CM

## 2013-04-25 NOTE — Patient Instructions (Signed)

## 2013-04-25 NOTE — Progress Notes (Signed)
62 y.o.  Married  Caucasian female   G0P0 here for annual exam.  Has recurrent migraine.  Sees Dr. Vela Prose and uses frova.  Also has accupuncture for her HA.    No LMP recorded. Patient is postmenopausal.        1987  Sexually active: yes  The current method of family planning is vasectomy and post menopausal status.    Exercising: fitness center treadmill, weights 2-3 days a week Last mammogram:  2 in the past 10 years Last pap smear:2005 neg History of abnormal pap: no Smoking: quit 27 years ago Alcohol:rarely Last colonoscopy:2011 normal, repeat in 10 years Last Bone Density:12/15/11 osteopenia Last tetanus shot: 2011 Last cholesterol check: 01/2013 normal  Sees Dr. Myna Hidalgo b/c she has a low platelet count  Hgb: hemotologist               Urine: pcp    Health Maintenance  Topic Date Due  . Pap Smear  04/07/1967  . Tetanus/tdap  04/06/1968  . Mammogram  04/07/1999  . Colonoscopy  04/07/1999  . Zostavax  04/06/2009  . Influenza Vaccine  08/14/2013    Family History  Problem Relation Age of Onset  . Diabetes Mother   . Hypertension Mother   . Stroke Mother   . Heart disease Father   . Cancer Father     lung cancer    Patient Active Problem List   Diagnosis Date Noted  . Thrombocytopenia 11/26/2011    Past Medical History  Diagnosis Date  . Thrombocytopenia 11/26/2011  . Depression   . Anxiety   . Arthritis   . Osteoporosis   . Headache   . Bursitis     Past Surgical History  Procedure Laterality Date  . Bunionectomy      both feet  . Anal fissure repair    . Wrist arthroplasty  2011    left   . Colonoscopy    . Hardware removal  04/14/2012    Procedure: HARDWARE REMOVAL;  Surgeon: Sharma Covert, MD;  Location: [facility];  Service: Orthopedics;  Laterality: Left;  left wrist deep hardware excision    Allergies: Corticosteroids; Cortisone; and Latex  Current Outpatient Prescriptions  Medication Sig Dispense Refill  . Calcium  Carbonate-Vitamin D (CALCIUM + D PO) Take 1,200 mg by mouth every morning.       . Cholecalciferol (VITAMIN D3) 1000 UNITS CAPS Take by mouth daily.      . citalopram (CELEXA) 40 MG tablet Take 20 mg by mouth Daily.       . clonazePAM (KLONOPIN) 1 MG tablet Take 1 mg by mouth Daily.      . frovatriptan (FROVA) 2.5 MG tablet Take 2.5 mg by mouth as needed. If recurs, may repeat after 2 hours. Max of 3 tabs in 24 hours.      . Magnesium 500 MG TABS Take by mouth daily.      . promethazine (PHENERGAN) 12.5 MG tablet Take 12.5 mg by mouth every 6 (six) hours as needed.         No current facility-administered medications for this visit.    ROS: Pertinent items are noted in HPI.  Social Hx: Married, no children, works prn dog sitting   Exam:    BP 100/62  Ht 5' 0.75" (1.543 m)  Wt 109 lb (49.442 kg)  BMI 20.77 kg/m2   Wt Readings from Last 3 Encounters:  04/25/13 109 lb (49.442 kg)  02/15/13 112 lb (50.803 kg)  10/20/12 112 lb (50.803 kg)     Ht Readings from Last 3 Encounters:  04/25/13 5' 0.75" (1.543 m)  02/15/13 5\' 1"  (1.549 m)  10/20/12 5\' 1"  (1.549 m)    General appearance: alert, cooperative and appears stated age Head: Normocephalic, without obvious abnormality, atraumatic Neck: no adenopathy, supple, symmetrical, trachea midline and thyroid not enlarged, symmetric, no tenderness/mass/nodules Lungs: clear to auscultation bilaterally Breasts: Inspection negative, No nipple retraction or dimpling, No nipple discharge or bleeding, No axillary or supraclavicular adenopathy, Normal to palpation without dominant masses Heart: regular rate and rhythm Abdomen: soft, non-tender; bowel sounds normal; no masses,  no organomegaly Extremities: extremities normal, atraumatic, no cyanosis or edema Skin: Skin color, texture, turgor normal. No rashes or lesions Lymph nodes: Cervical, supraclavicular, and axillary nodes normal. No abnormal inguinal nodes palpated Neurologic: Grossly  normal   Pelvic: External genitalia:  no lesions              Urethra:  normal appearing urethra with no masses, tenderness or lesions              Bartholins and Skenes: normal                 Vagina: normal appearing vagina with normal color and discharge, no lesions, atrophic              Cervix: normal appearance              Pap taken: yes        Bimanual Exam:  Uterus:  uterus is normal size, shape, consistency and nontender, mid, mobile                                      Adnexa: normal adnexa in size, nontender and no masses                                      Rectovaginal: Confirms                                      Anus:  normal sphincter tone, no lesions  A: normal menopausal exam, no HRT     H/o thrombocytopenia, sees Dr. Myna Hidalgo, dx'd as viral, runs about 100k     Migraine without aura     P: mammogram pap smear counseled on breast self exam, mammography screening, adequate intake of calcium and vitamin D, diet and exercise return annually or prn     An After Visit Summary was printed and given to the patient.

## 2013-04-27 LAB — IPS PAP TEST WITH HPV

## 2013-06-20 ENCOUNTER — Other Ambulatory Visit: Payer: Self-pay

## 2013-06-20 DIAGNOSIS — Z1231 Encounter for screening mammogram for malignant neoplasm of breast: Secondary | ICD-10-CM

## 2013-07-11 ENCOUNTER — Ambulatory Visit
Admission: RE | Admit: 2013-07-11 | Discharge: 2013-07-11 | Disposition: A | Discharge status: Home / Self Care | Payer: BC Managed Care – PPO | Source: Ambulatory Visit

## 2013-07-11 DIAGNOSIS — Z1231 Encounter for screening mammogram for malignant neoplasm of breast: Secondary | ICD-10-CM

## 2013-07-19 ENCOUNTER — Other Ambulatory Visit: Payer: Self-pay | Admitting: Obstetrics and Gynecology

## 2013-07-19 DIAGNOSIS — R928 Other abnormal and inconclusive findings on diagnostic imaging of breast: Secondary | ICD-10-CM

## 2013-08-07 ENCOUNTER — Ambulatory Visit
Admission: RE | Admit: 2013-08-07 | Discharge: 2013-08-07 | Disposition: A | Discharge status: Home / Self Care | Payer: BC Managed Care – PPO | Source: Ambulatory Visit | Attending: Obstetrics and Gynecology | Admitting: Obstetrics and Gynecology

## 2013-08-07 DIAGNOSIS — R928 Other abnormal and inconclusive findings on diagnostic imaging of breast: Secondary | ICD-10-CM

## 2013-08-15 NOTE — Progress Notes (Signed)
Recall for 01-29-14 entered.

## 2013-10-17 ENCOUNTER — Other Ambulatory Visit (HOSPITAL_BASED_OUTPATIENT_CLINIC_OR_DEPARTMENT_OTHER): Payer: BC Managed Care – PPO | Admitting: Lab

## 2013-10-17 ENCOUNTER — Ambulatory Visit (HOSPITAL_BASED_OUTPATIENT_CLINIC_OR_DEPARTMENT_OTHER): Payer: BC Managed Care – PPO | Admitting: Hematology & Oncology

## 2013-10-17 VITALS — BP 102/50 | HR 53 | Temp 98.0°F | Resp 14 | Ht 60.0 in | Wt 110.0 lb

## 2013-10-17 DIAGNOSIS — D72819 Decreased white blood cell count, unspecified: Secondary | ICD-10-CM

## 2013-10-17 DIAGNOSIS — D696 Thrombocytopenia, unspecified: Secondary | ICD-10-CM

## 2013-10-17 LAB — CBC WITH DIFFERENTIAL (CANCER CENTER ONLY)
BASO#: 0 10*3/uL (ref 0.0–0.2)
BASO%: 0.4 % (ref 0.0–2.0)
EOS%: 0.4 % (ref 0.0–7.0)
HCT: 38.6 % (ref 34.8–46.6)
HGB: 13.1 g/dL (ref 11.6–15.9)
LYMPH#: 1.3 10*3/uL (ref 0.9–3.3)
LYMPH%: 45.6 % (ref 14.0–48.0)
MCH: 31.3 pg (ref 26.0–34.0)
MCHC: 33.9 g/dL (ref 32.0–36.0)
MCV: 92 fL (ref 81–101)
MONO%: 7.7 % (ref 0.0–13.0)
NEUT%: 45.9 % (ref 39.6–80.0)
RDW: 12.1 % (ref 11.1–15.7)

## 2013-10-19 NOTE — Progress Notes (Signed)
This office note has been dictated.

## 2013-10-23 NOTE — Progress Notes (Signed)
CC:   Marissa Kim, PA-C  DIAGNOSIS:  Chronic leukopenia/thrombocytopenia.  CURRENT THERAPY:  Observation.  INTERIM HISTORY:  [name] comes in for her yearly followup.  She is doing pretty well.  She really has had no problems since we last saw her.  Her weight has been holding pretty stable.  She has had, I think, no change in medications.  She has been to the beach, I think, a couple of times.  She has had no fever, sweats, or chills.  There has been no bleeding. There has been no bruising.  She has had no leg swelling or joint issues.  PHYSICAL EXAMINATION:  General:  This is a petite white female in no obvious distress.  Vital Signs:  Temperature 98, pulse 53, respiratory rate 14, blood pressure 102/50, weight is 110 pounds.  Head and Neck: Normocephalic, atraumatic skull.  There are no ocular or oral lesions. She has no palpable cervical or supraclavicular lymph nodes.  Lungs: Clear bilaterally.  Cardiac:  Regular rate and rhythm, with a normal S1 and S2.  There are no murmurs, rubs, or bruits.  Abdomen:  Soft.  She has good bowel sounds.  There is no fluid wave.  There is no guarding or rebound tenderness.  There is no palpable hepatosplenomegaly.  Back:  No tenderness over the spine, ribs, or hips.  Extremities:  No clubbing, cyanosis, or edema.  She has good range of motion of her joints.  She has good muscle strength in her upper and lower extremities.  Skin:  No rashes, ecchymoses, or petechiae.  Neurological:  No focal neurological deficits.  LABORATORY STUDIES:  White cell count is 2.7, hemoglobin 13.1, hematocrit 38.6, platelet count 104.  MCV is 92.  Peripheral smear shows a normochromic, normocytic population of red blood cells.  There are no nucleated red blood cells.  I see no teardrop cells.  I see no rouleaux formation.  She has no schistocytes or spherocytes.  White cells appear slightly decreased in number.  She has good maturation of her white blood  cells.  I see no hypersegmented polys.  There are no immature myeloid cells.  There are no atypical lymphocytes.  Platelets are mildly decreased in number.  Platelets are well-granulated.  I see no large platelets.  IMPRESSION:  [name] is a very charming 62 year old white female with chronic leukopenia and thrombocytopenia.  We have been following her now for a few years.  Everything has been holding pretty steady within a range.  Her blood smear certainly looked okay.  I do not find anything different on her physical exam.  It is possible that the leukopenia and thrombocytopenia could be medicine-related.  It is hard to say if there is anything from a nutritional point of view that could be causing this.  She certainly "lives with this" and has had no problems with this.  She has had no increase in infections or increase in bleeding or bruising.  We will plan to get her back in another year for followup.    ______________________________ Josph Macho, M.D. PRE/MEDQ  D:  10/18/2013  T:  10/20/2013  Job:  [id number]

## 2014-01-16 ENCOUNTER — Other Ambulatory Visit: Payer: Self-pay | Admitting: Obstetrics and Gynecology

## 2014-01-16 DIAGNOSIS — N63 Unspecified lump in unspecified breast: Secondary | ICD-10-CM

## 2014-01-29 ENCOUNTER — Ambulatory Visit
Admission: RE | Admit: 2014-01-29 | Discharge: 2014-01-29 | Disposition: A | Discharge status: Home / Self Care | Payer: Medicare PPO | Source: Ambulatory Visit | Attending: Obstetrics and Gynecology | Admitting: Obstetrics and Gynecology

## 2014-01-29 DIAGNOSIS — N63 Unspecified lump in unspecified breast: Secondary | ICD-10-CM

## 2014-04-16 ENCOUNTER — Other Ambulatory Visit: Payer: BC Managed Care – PPO | Admitting: Lab

## 2014-04-16 ENCOUNTER — Ambulatory Visit: Payer: BC Managed Care – PPO | Admitting: Hematology & Oncology

## 2014-04-19 ENCOUNTER — Encounter: Payer: Self-pay | Admitting: Hematology & Oncology

## 2014-04-19 ENCOUNTER — Ambulatory Visit (HOSPITAL_BASED_OUTPATIENT_CLINIC_OR_DEPARTMENT_OTHER): Payer: BC Managed Care – PPO | Admitting: Hematology & Oncology

## 2014-04-19 ENCOUNTER — Other Ambulatory Visit (HOSPITAL_BASED_OUTPATIENT_CLINIC_OR_DEPARTMENT_OTHER): Payer: Medicare PPO | Admitting: Lab

## 2014-04-19 VITALS — BP 90/60 | HR 63 | Temp 97.6°F | Resp 14 | Ht 60.0 in | Wt 108.0 lb

## 2014-04-19 DIAGNOSIS — D72819 Decreased white blood cell count, unspecified: Secondary | ICD-10-CM

## 2014-04-19 DIAGNOSIS — D696 Thrombocytopenia, unspecified: Secondary | ICD-10-CM

## 2014-04-19 LAB — CBC WITH DIFFERENTIAL (CANCER CENTER ONLY)
BASO#: 0 10*3/uL (ref 0.0–0.2)
BASO%: 0.3 % (ref 0.0–2.0)
EOS%: 2.6 % (ref 0.0–7.0)
Eosinophils Absolute: 0.1 10*3/uL (ref 0.0–0.5)
HCT: 37.7 % (ref 34.8–46.6)
HGB: 12.9 g/dL (ref 11.6–15.9)
LYMPH#: 1.5 10*3/uL (ref 0.9–3.3)
LYMPH%: 47.1 % (ref 14.0–48.0)
MCH: 31.6 pg (ref 26.0–34.0)
MCHC: 34.2 g/dL (ref 32.0–36.0)
MCV: 92 fL (ref 81–101)
MONO#: 0.4 10*3/uL (ref 0.1–0.9)
MONO%: 11.9 % (ref 0.0–13.0)
NEUT%: 38.1 % — ABNORMAL LOW (ref 39.6–80.0)
NEUTROS ABS: 1.2 10*3/uL — AB (ref 1.5–6.5)
PLATELETS: 108 10*3/uL — AB (ref 145–400)
RBC: 4.08 10*6/uL (ref 3.70–5.32)
RDW: 12.1 % (ref 11.1–15.7)
WBC: 3.1 10*3/uL — ABNORMAL LOW (ref 3.9–10.0)

## 2014-04-19 LAB — CHCC SATELLITE - SMEAR

## 2014-04-19 NOTE — Progress Notes (Signed)
Hematology and Oncology Follow Up Visit  [name] [id number] [birth year] 63 y.o. 04/19/2014   Principle Diagnosis:   Chronic leukopenia and thrombocytopenia  Current Therapy:    Observation     Interim History:  Ms.  Barker is back for followup. We last saw her probably a year ago. She's doing well. She and her husband were exam does is go recently. He wanted to. They enjoy themselves quite a bit.  She's had no change in medications. He's had no problems infections. She's had no rashes. She's had no bleeding or bruising. There's been no change in bowel or bladder habits.  We have never uncovered an etiology for the leukopenia and thrombocytopenia. She's been very much asymptomatic with these.  She had a mammogram last year in August. This was negative for any suspicious findings.  Medications: Current outpatient prescriptions:Calcium Carbonate-Vitamin D (CALCIUM + D PO), Take 1,200 mg by mouth every morning. , Disp: , Rfl: ;  Cholecalciferol (VITAMIN D3) 1000 UNITS CAPS, Take by mouth daily., Disp: , Rfl: ;  citalopram (CELEXA) 40 MG tablet, Take 20 mg by mouth Daily. , Disp: , Rfl: ;  clonazePAM (KLONOPIN) 1 MG tablet, Take 1 mg by mouth Daily., Disp: , Rfl:  frovatriptan (FROVA) 2.5 MG tablet, Take 2.5 mg by mouth as needed. may repeat after 2 hours. Max of 3 tabs in 24 hours., Disp: , Rfl: ;  Magnesium 500 MG TABS, Take by mouth daily., Disp: , Rfl: ;  Melatonin 10 MG TABS, Take by mouth at bedtime., Disp: , Rfl: ;  promethazine (PHENERGAN) 12.5 MG tablet, Take 12.5 mg by mouth every 6 (six) hours as needed.  , Disp: , Rfl:   Allergies:  Allergies  Allergen Reactions  . Corticosteroids Anxiety  . Cortisone   . Latex Rash    Past Medical History, Surgical history, Social history, and Family History were reviewed and updated.  Review of Systems: As above  Physical Exam:  height is 5' (1.524 m) and weight is 108 lb (48.988 kg). Her oral temperature is 97.6 F (36.4 C). Her  blood pressure is 90/60 and her pulse is 63. Her respiration is 14.   Petit white female. Her head and neck exam shows no ocular or oral lesions. She has no adenopathy. Lungs are clear. Cardiac exam regular in rhythm. She has no murmurs rubs or bruits. Abdomen is soft. She has good bowel sounds. There is no fluid wave. There is no palpable liver or spleen tip. Neck exam shows a slight kyphosis. Extremities shows no clubbing cyanosis or edema. Neurological exam is nonfocal. Skin exam no rashes ecchymosis or petechia.  Lab Results  Component Value Date   WBC 3.1* 04/19/2014   HGB 12.9 04/19/2014   HCT 37.7 04/19/2014   MCV 92 04/19/2014   PLT 108* 04/19/2014     Chemistry      Component Value Date/Time   NA 136 07/30/2011 1505   K 4.5 07/30/2011 1505   CL 102 07/30/2011 1505   CO2 29 07/30/2011 1505   BUN 15 07/30/2011 1505   CREATININE 0.83 07/30/2011 1505      Component Value Date/Time   CALCIUM 9.2 07/30/2011 1505   ALKPHOS 51 07/30/2011 1505   AST 23 07/30/2011 1505   ALT 9 07/30/2011 1505   BILITOT 0.5 07/30/2011 1505         Impression and Plan: [name] is 63 year old ld female with chronic leukopenia and thrombocytopenia. Her blood counts have not  changed since we have seen her. I have not felt that we have had to do a bone marrow on her. Her blood smear as well has looked benign.  I just don't think that we need to see her unless there is a problem. I do think that she has a chronic benign process. I don't think an extensive workup is indicated.  We will go ahead and see her back if necessary. It is always when the talked to her. I was glad that she is able to do what she wishes and travel.   Volanda Napoleon, MD 5/7/20157:12 PM

## 2014-04-30 ENCOUNTER — Ambulatory Visit: Payer: BC Managed Care – PPO | Admitting: Obstetrics and Gynecology

## 2014-04-30 ENCOUNTER — Ambulatory Visit (INDEPENDENT_AMBULATORY_CARE_PROVIDER_SITE_OTHER): Payer: PRIVATE HEALTH INSURANCE | Admitting: Obstetrics and Gynecology

## 2014-04-30 ENCOUNTER — Encounter: Payer: Self-pay | Admitting: Obstetrics and Gynecology

## 2014-04-30 VITALS — BP 100/66 | HR 60 | Ht 61.0 in | Wt 108.0 lb

## 2014-04-30 DIAGNOSIS — M949 Disorder of cartilage, unspecified: Secondary | ICD-10-CM

## 2014-04-30 DIAGNOSIS — R6889 Other general symptoms and signs: Secondary | ICD-10-CM

## 2014-04-30 DIAGNOSIS — Z01419 Encounter for gynecological examination (general) (routine) without abnormal findings: Secondary | ICD-10-CM

## 2014-04-30 DIAGNOSIS — M8580 Other specified disorders of bone density and structure, unspecified site: Secondary | ICD-10-CM

## 2014-04-30 DIAGNOSIS — M899 Disorder of bone, unspecified: Secondary | ICD-10-CM

## 2014-04-30 NOTE — Progress Notes (Signed)
Patient ID: [name], female   DOB: 1951-08-22, 63 y.o.   MRN: [id number] GYNECOLOGY VISIT  PCP:  Betty Martinique, MD/Peter  Marin Olp, MD  Referring provider:   HPI: 63 y.o.   Married  Caucasian  female   G0P0 with Patient's last menstrual period was 12/14/1985.  (age 61). here for annual exam.   Mother has dementia and diabetes.  Stressful for family.   Dealing with chronic migraines:  Nine per month.  Acupuncture through chiropractor is helping a lot.   Same vaginal dryness.  Does not do well with hormone replacement.  Did not tolerate black cohosh.   History of Fosamax for osteoporosis use for sever years but stopped.  Follow up bone density after 2008 showed osteopenia.   Sees Dr. Jonette Eva for thrombocytopenia and leukopenia.  Life long constipation and cold intolerance.  Has had normal thyroid function.   Hgb:    PCP Urine:   PCP  GYNECOLOGIC HISTORY: Patient's last menstrual period was 12/14/1985. Sexually active:  yes Partner preference: female Contraception: postmenopausal   Menopausal hormone therapy: no DES exposure: no   Blood transfusions:  no  Sexually transmitted diseases: no   GYN procedures and prior surgeries:  no Last mammogram: 07/2013 possible mass in left breast.  Had  Diagnostic left mammogram and ultrasound  And revealed probable benign findings in left breast.  Repeat scan in six months.  Left diagmostic mammogram and ultrasound 01/1014  Heterogeneously dense  Tissue consistent with fibroglandular tissue in left breast.  Recommend bilateral screening mammogram in six months.           Last pap and high risk HPV testing:  04-25-13 wnl:neg HR HPV.  History of abnormal pap smear:  no   OB History   Grav Para Term Preterm Abortions TAB SAB Ect Mult Living   0                LIFESTYLE: Exercise:    Cardio and weights           Tobacco:    no Alcohol:      no Drug use:  no  OTHER HEALTH MAINTENANCE: Tetanus/TDap:   2011 Gardisil:               n/a Influenza:            09/2013 Zostavax:            no  Bone density:     Osteopenia - 2008 Colonoscopy:     2011 wnl in [facility].  Next colonoscopy due 2021.  Cholesterol check:  wnl  Family History  Problem Relation Age of Onset  . Diabetes Mother   . Hypertension Mother   . Stroke Mother   . Heart disease Father   . Cancer Father     lung cancer    Patient Active Problem List   Diagnosis Date Noted  . Thrombocytopenia 11/26/2011   Past Medical History  Diagnosis Date  . Thrombocytopenia 11/26/2011  . Depression   . Anxiety   . Arthritis   . Osteoporosis   . Headache(784.0)   . Bursitis     Past Surgical History  Procedure Laterality Date  . Bunionectomy      both feet  . Anal fissure repair    . Wrist arthroplasty  2011    left   . Colonoscopy    . Hardware removal  04/14/2012    Procedure: HARDWARE REMOVAL;  Surgeon: Linna Hoff, MD;  Location:  [facility];  Service: Orthopedics;  Laterality: Left;  left wrist deep hardware excision    ALLERGIES: Corticosteroids; Cortisone; and Latex  Current Outpatient Prescriptions  Medication Sig Dispense Refill  . Cholecalciferol (VITAMIN D3) 1000 UNITS CAPS Take by mouth daily.      . citalopram (CELEXA) 40 MG tablet Take 20 mg by mouth Daily.       . clonazePAM (KLONOPIN) 1 MG tablet Take 1 mg by mouth Daily.      Marland Kitchen isometheptene-acetaminophen-dichloralphenazone (MIDRIN) 65-325-100 MG capsule Take 1 capsule by mouth as needed.      . Magnesium 500 MG TABS Take by mouth daily.      . Melatonin 10 MG TABS Take by mouth at bedtime.      . promethazine (PHENERGAN) 12.5 MG tablet Take 12.5 mg by mouth every 6 (six) hours as needed.        . Calcium Carbonate-Vitamin D (CALCIUM + D PO) Take 1,200 mg by mouth every morning.       . frovatriptan (FROVA) 2.5 MG tablet Take 2.5 mg by mouth as needed. may repeat after 2 hours. Max of 3 tabs in 24 hours.       No current facility-administered medications  for this visit.     ROS:  Pertinent items are noted in HPI.  SOCIAL HISTORY:  Retired.  PHYSICAL EXAMINATION:    BP 100/66  Pulse 60  Ht 5\' 1"  (1.549 m)  Wt 108 lb (48.988 kg)  BMI 20.42 kg/m2  LMP 12/14/1985   Wt Readings from Last 3 Encounters:  04/30/14 108 lb (48.988 kg)  04/19/14 108 lb (48.988 kg)  10/17/13 110 lb (49.896 kg)     Ht Readings from Last 3 Encounters:  04/30/14 5\' 1"  (1.549 m)  04/19/14 5' (1.524 m)  10/17/13 5' (1.524 m)    General appearance: alert, cooperative and appears stated age Head: Normocephalic, without obvious abnormality, atraumatic Neck: no adenopathy, supple, symmetrical, trachea midline and thyroid not enlarged, symmetric, no tenderness/mass/nodules Lungs: clear to auscultation bilaterally Breasts: Inspection negative, No nipple retraction or dimpling, No nipple discharge or bleeding, No axillary or supraclavicular adenopathy, Normal to palpation without dominant masses Heart: regular rate and rhythm Abdomen: soft, non-tender; no masses,  no organomegaly Extremities: extremities normal, atraumatic, no cyanosis or edema Skin: Skin color, texture, turgor normal. No rashes or lesions Lymph nodes: Cervical, supraclavicular, and axillary nodes normal. No abnormal inguinal nodes palpated Neurologic: Grossly normal  Pelvic: External genitalia:  no lesions              Urethra:  normal appearing urethra with no masses, tenderness or lesions              Bartholins and Skenes: normal                 Vagina: normal appearing vagina with normal color and discharge, no lesions              Cervix: normal appearance              Pap and high risk HPV testing done: no.            Bimanual Exam:  Uterus:  uterus is normal size, shape, consistency and nontender                                      Adnexa: normal adnexa in size,  nontender and no masses                                      Rectovaginal: Confirms                                       Anus:  normal sphincter tone, no lesions  ASSESSMENT  Normal gynecologic exam. Cold intolerance.  Chronic constipation.  Osteopenia. Status post Fosamax. Chronic leukopenia and thrombocytopenia.   PLAN  Mammogram recommended yearly.  [facility]. Pap smear and high risk HPV testing not indicated.  Bone density at [facility].  TSH check.  Counseled on self breast exam, Calcium and vitamin D intake, exercise. Return annually or prn   An After Visit Summary was printed and given to the patient.

## 2014-04-30 NOTE — Patient Instructions (Signed)

## 2014-05-01 LAB — TSH: TSH: 2.186 u[IU]/mL (ref 0.350–4.500)

## 2014-10-30 ENCOUNTER — Ambulatory Visit
Admission: RE | Admit: 2014-10-30 | Discharge: 2014-10-30 | Disposition: A | Discharge status: Home / Self Care | Payer: Medicare PPO | Source: Ambulatory Visit | Attending: Obstetrics and Gynecology | Admitting: Obstetrics and Gynecology

## 2014-10-30 DIAGNOSIS — M8580 Other specified disorders of bone density and structure, unspecified site: Secondary | ICD-10-CM

## 2014-11-05 ENCOUNTER — Telehealth: Payer: Self-pay

## 2014-11-05 NOTE — Telephone Encounter (Signed)
Pt returning call

## 2014-11-05 NOTE — Telephone Encounter (Signed)
Called patient at [phone] and [phone] to discuss Bone Density results, LMOVM on both phones and asked her to call me back.

## 2014-11-05 NOTE — Telephone Encounter (Signed)
Patient notified

## 2014-11-05 NOTE — Telephone Encounter (Signed)
-----   Message from [place], MD sent at 11/04/2014  4:51 PM EST ----- Please have the patient's chart pulled for my review and place it on my desk.  I do not believe that I have seen this yet to compare to her last bone density.  From this bone density study, she has osteopenia of the spine and osteoporosis of the hip (just barely in the osteoporosis category.) On chart review, she has used Fosamax in the past and has a diagnosis of osteoporosis by report.   Please let the patient know that her formal diagnosis is osteoporosis and that I will review this further after I return from my vacation week.  The office is currently closed.

## 2014-11-19 ENCOUNTER — Telehealth: Payer: Self-pay

## 2014-11-19 MED ORDER — ALENDRONATE SODIUM 70 MG PO TABS: 70.0000 mg | ORAL_TABLET | ORAL | Status: DC

## 2014-11-19 NOTE — Telephone Encounter (Signed)
-----   Message from [place], MD sent at 11/18/2014 11:14 AM EST ----- I have received results from the patient's previous bone density from [facility] 2006 and this shows: T score of the spine was - 2.1. T score of the left hip was - 2.0.  In summary, this means that her spine is better, but that her left hip is worse.   She is now officially osteoporotic.   I am recommending restarting her Fosamax, as generic alendronate 70 mg weekly.  Please place the order after discussing this with her.  She will need to take calcium 600 mg plus vitamin D twice daily. I think that she was previously taking all of the calcium/vit D at once in the morning, and it will not be adequately this way.   Next bone density in 2 years.   I welcome an office visit if she has questions or concerns that need to be addressed.   Cc - Marisa Sprinkles.

## 2014-11-19 NOTE — Telephone Encounter (Signed)
Spoke with patient. Advised patient of message as seen below from [place]. Patient is agreeable. Would like to restart on Fosamax at this time. Will take calcium 600 mg with Vitamin D twice daily. Patient will return call if she needs anything or would like to come in to speak with Dr.Silva. Alendronate 70 mg #4 6RF until next aex sent to pharmacy on file.  Routing to provider for final review. Patient agreeable to disposition. Will close encounter

## 2014-11-19 NOTE — Telephone Encounter (Signed)
Left message to call Sarie Stall at [phone]. 

## 2014-11-19 NOTE — Telephone Encounter (Signed)
Pt returning call

## 2015-02-25 DIAGNOSIS — H2513 Age-related nuclear cataract, bilateral: Secondary | ICD-10-CM | POA: Diagnosis not present

## 2015-02-25 DIAGNOSIS — H43813 Vitreous degeneration, bilateral: Secondary | ICD-10-CM | POA: Diagnosis not present

## 2015-02-25 DIAGNOSIS — H539 Unspecified visual disturbance: Secondary | ICD-10-CM | POA: Diagnosis not present

## 2015-03-18 DIAGNOSIS — H43813 Vitreous degeneration, bilateral: Secondary | ICD-10-CM | POA: Diagnosis not present

## 2015-03-18 DIAGNOSIS — H47213 Primary optic atrophy, bilateral: Secondary | ICD-10-CM | POA: Diagnosis not present

## 2015-03-18 DIAGNOSIS — H2513 Age-related nuclear cataract, bilateral: Secondary | ICD-10-CM | POA: Diagnosis not present

## 2015-04-17 DIAGNOSIS — M25341 Other instability, right hand: Secondary | ICD-10-CM | POA: Diagnosis not present

## 2015-04-17 DIAGNOSIS — M19041 Primary osteoarthritis, right hand: Secondary | ICD-10-CM | POA: Diagnosis not present

## 2015-04-17 DIAGNOSIS — S63111A Subluxation of metacarpophalangeal joint of right thumb, initial encounter: Secondary | ICD-10-CM | POA: Diagnosis not present

## 2015-04-17 HISTORY — PX: OTHER SURGICAL HISTORY: SHX169

## 2015-05-07 ENCOUNTER — Ambulatory Visit (INDEPENDENT_AMBULATORY_CARE_PROVIDER_SITE_OTHER): Payer: Medicare PPO | Admitting: Obstetrics & Gynecology

## 2015-05-07 ENCOUNTER — Encounter: Payer: Self-pay | Admitting: Obstetrics & Gynecology

## 2015-05-07 ENCOUNTER — Other Ambulatory Visit: Payer: Self-pay | Admitting: Obstetrics & Gynecology

## 2015-05-07 VITALS — BP 102/68 | HR 60 | Resp 14 | Ht 60.5 in | Wt 108.0 lb

## 2015-05-07 DIAGNOSIS — Z124 Encounter for screening for malignant neoplasm of cervix: Secondary | ICD-10-CM | POA: Diagnosis not present

## 2015-05-07 DIAGNOSIS — E785 Hyperlipidemia, unspecified: Secondary | ICD-10-CM | POA: Diagnosis not present

## 2015-05-07 DIAGNOSIS — Z0000 Encounter for general adult medical examination without abnormal findings: Secondary | ICD-10-CM

## 2015-05-07 DIAGNOSIS — Z01419 Encounter for gynecological examination (general) (routine) without abnormal findings: Secondary | ICD-10-CM | POA: Diagnosis not present

## 2015-05-07 DIAGNOSIS — M810 Age-related osteoporosis without current pathological fracture: Secondary | ICD-10-CM

## 2015-05-07 DIAGNOSIS — Z1231 Encounter for screening mammogram for malignant neoplasm of breast: Secondary | ICD-10-CM

## 2015-05-07 LAB — POCT URINALYSIS DIPSTICK
Bilirubin, UA: NEGATIVE
Blood, UA: NEGATIVE
GLUCOSE UA: NEGATIVE
Leukocytes, UA: NEGATIVE
Nitrite, UA: NEGATIVE
Protein, UA: NEGATIVE
Spec Grav, UA: 1.02
UROBILINOGEN UA: NEGATIVE
pH, UA: 6.5

## 2015-05-07 NOTE — Progress Notes (Signed)
Called and scheduled mmg for 05/21/15 at 2:20 pm at [facility], patient is aware.

## 2015-05-07 NOTE — Addendum Note (Signed)
Addended by: Alfonzo Feller on: 05/07/2015 03:15 PM   Modules accepted: Miquel Dunn

## 2015-05-07 NOTE — Progress Notes (Signed)
Patient ID: [name], female   DOB: 12/08/1951, 64 y.o.   MRN: [id number]   64 y.o. G0P0 MarriedCaucasianF here for annual exam.  Doing well.  No vaginal bleeding.  Pt has seen Dr. Marin Barker for mildly low platelt count and low WBC ct over last three to four years.  Was released last year as plt ct stays around 100-110.  WBC ct always around 3.0.  No vaginal bleeding.  PCP:  Dr. Martinique at [facility].  [name]).    Patient's last menstrual period was 12/14/1985.          Sexually active: Yes.    The current method of family planning is post menopausal status.    Exercising: Yes.    walking, weights, gym Smoker:  no  Health Maintenance: Pap:  04-25-13 WNL neg, neg HPV History of abnormal Pap:  no MMG:  01-29-14 Left DX/ BI screening in 6 months- Per Patient she was told to repeat in 1 yr. Patient has not yet had repeat MM Colonoscopy:  2011 repeat in 10 years BMD:   10-30-14 Osteoporosis ( Fosamax)  TDaP:  2012 Screening Labs: today, Hb today: today, Urine today: + ketones   reports that she quit smoking about 29 years ago. Her smoking use included Cigarettes. She started smoking about 35 years ago. She has a 3.5 pack-year smoking history. She has never used smokeless tobacco. She reports that she drinks alcohol. She reports that she does not use illicit drugs.  Past Medical History  Diagnosis Date  . Thrombocytopenia 11/26/2011  . Depression   . Anxiety   . Arthritis   . Osteoporosis   . Headache(784.0)   . Bursitis     Past Surgical History  Procedure Laterality Date  . Bunionectomy      both feet  . Anal fissure repair    . Wrist arthroplasty  2011    left   . Colonoscopy    . Hardware removal  04/14/2012    Procedure: HARDWARE REMOVAL;  Surgeon: Marissa Hoff, MD;  Location: [place];  Service: Orthopedics;  Laterality: Left;  left wrist deep hardware excision    Current Outpatient Prescriptions  Medication Sig Dispense Refill  . alendronate  (FOSAMAX) 70 MG tablet Take 1 tablet (70 mg total) by mouth every 7 (seven) days. Take with a full glass of water on an empty stomach. 4 tablet 6  . Calcium Carbonate-Vitamin D (CALCIUM + D PO) Take 1,200 mg by mouth every morning.     . Cholecalciferol (VITAMIN D3) 1000 UNITS CAPS Take by mouth daily.    . citalopram (CELEXA) 40 MG tablet Take 20 mg by mouth Daily.     . clonazePAM (KLONOPIN) 1 MG tablet Take 1 mg by mouth Daily.    . frovatriptan (FROVA) 2.5 MG tablet Take 2.5 mg by mouth as needed. may repeat after 2 hours. Max of 3 tabs in 24 hours.    Marland Kitchen isometheptene-acetaminophen-dichloralphenazone (MIDRIN) 65-325-100 MG capsule Take 1 capsule by mouth as needed.    . Magnesium 500 MG TABS Take by mouth daily.    . Melatonin 10 MG TABS Take by mouth at bedtime.    . promethazine (PHENERGAN) 12.5 MG tablet Take 12.5 mg by mouth every 6 (six) hours as needed.       No current facility-administered medications for this visit.    Family History  Problem Relation Age of Onset  . Diabetes Mother   . Hypertension Mother   .  Stroke Mother   . Heart disease Father   . Cancer Father     lung cancer    ROS:  Pertinent items are noted in HPI.  Otherwise, a comprehensive ROS was negative.  Exam:   LMP 12/14/1985       Ht Readings from Last 3 Encounters:  04/30/14 5\' 1"  (1.549 m)  04/19/14 5' (1.524 m)  10/17/13 5' (1.524 m)    General appearance: alert, cooperative and appears stated age Head: Normocephalic, without obvious abnormality, atraumatic Neck: no adenopathy, supple, symmetrical, trachea midline and thyroid normal to inspection and palpation Lungs: clear to auscultation bilaterally Breasts: normal appearance, no masses or tenderness Heart: regular rate and rhythm Abdomen: soft, non-tender; bowel sounds normal; no masses,  no organomegaly Extremities: extremities normal, atraumatic, no cyanosis or edema Skin: Skin color, texture, turgor normal. No rashes or lesions Lymph  nodes: Cervical, supraclavicular, and axillary nodes normal. No abnormal inguinal nodes palpated Neurologic: Grossly normal   Pelvic: External genitalia:  no lesions              Urethra:  normal appearing urethra with no masses, tenderness or lesions              Bartholins and Skenes: normal                 Vagina: normal appearing vagina with normal color and discharge, no lesions              Cervix: no lesions              Pap taken: Yes.   Bimanual Exam:  Uterus:  normal size, contour, position, consistency, mobility, non-tender              Adnexa: normal adnexa and no mass, fullness, tenderness               Rectovaginal: Confirms               Anus:  normal sphincter tone, no lesions  Chaperone was present for exam.  A:  Well Woman with normal exam PMP, no HRT H/O thrombocytopenia and leukopenia, released from Dr. Marin Barker last year H/O abnormal MMG that had six month follow up.  Pt did not go for MMG last year--didn't know she needed to go.  P:   Mammogram will be scheduled for pt pap smear obtained today Pt will return for fasting labs return annually or prn

## 2015-05-07 NOTE — Addendum Note (Signed)
Addended by: Alfonzo Feller on: 05/07/2015 03:13 PM   Modules accepted: Miquel Dunn

## 2015-05-09 LAB — IPS PAP SMEAR ONLY

## 2015-05-20 DIAGNOSIS — S63111D Subluxation of metacarpophalangeal joint of right thumb, subsequent encounter: Secondary | ICD-10-CM | POA: Diagnosis not present

## 2015-05-21 ENCOUNTER — Ambulatory Visit
Admission: RE | Admit: 2015-05-21 | Discharge: 2015-05-21 | Disposition: A | Discharge status: Home / Self Care | Payer: Medicare PPO | Source: Ambulatory Visit | Attending: Obstetrics & Gynecology | Admitting: Obstetrics & Gynecology

## 2015-05-21 DIAGNOSIS — Z1231 Encounter for screening mammogram for malignant neoplasm of breast: Secondary | ICD-10-CM

## 2015-05-24 ENCOUNTER — Other Ambulatory Visit: Payer: Self-pay | Admitting: Obstetrics and Gynecology

## 2015-05-24 NOTE — Telephone Encounter (Signed)
Medication refill request: Fosamax  Last AEX:  05-07-15 Next AEX: 07-24-16 Last MMG (if hormonal medication request): 05-21-15 WNL Refill authorized: please advise

## 2015-05-28 DIAGNOSIS — S63111D Subluxation of metacarpophalangeal joint of right thumb, subsequent encounter: Secondary | ICD-10-CM | POA: Diagnosis not present

## 2015-05-30 DIAGNOSIS — S63111D Subluxation of metacarpophalangeal joint of right thumb, subsequent encounter: Secondary | ICD-10-CM | POA: Diagnosis not present

## 2015-06-04 DIAGNOSIS — S63111D Subluxation of metacarpophalangeal joint of right thumb, subsequent encounter: Secondary | ICD-10-CM | POA: Diagnosis not present

## 2015-06-06 DIAGNOSIS — S63111D Subluxation of metacarpophalangeal joint of right thumb, subsequent encounter: Secondary | ICD-10-CM | POA: Diagnosis not present

## 2015-06-11 DIAGNOSIS — S63111D Subluxation of metacarpophalangeal joint of right thumb, subsequent encounter: Secondary | ICD-10-CM | POA: Diagnosis not present

## 2015-06-13 DIAGNOSIS — S63111D Subluxation of metacarpophalangeal joint of right thumb, subsequent encounter: Secondary | ICD-10-CM | POA: Diagnosis not present

## 2015-06-18 DIAGNOSIS — Z4789 Encounter for other orthopedic aftercare: Secondary | ICD-10-CM | POA: Diagnosis not present

## 2015-06-18 DIAGNOSIS — S63111D Subluxation of metacarpophalangeal joint of right thumb, subsequent encounter: Secondary | ICD-10-CM | POA: Diagnosis not present

## 2015-06-20 DIAGNOSIS — S63111D Subluxation of metacarpophalangeal joint of right thumb, subsequent encounter: Secondary | ICD-10-CM | POA: Diagnosis not present

## 2015-06-27 DIAGNOSIS — S63111D Subluxation of metacarpophalangeal joint of right thumb, subsequent encounter: Secondary | ICD-10-CM | POA: Diagnosis not present

## 2015-07-04 DIAGNOSIS — S63111D Subluxation of metacarpophalangeal joint of right thumb, subsequent encounter: Secondary | ICD-10-CM | POA: Diagnosis not present

## 2015-07-04 DIAGNOSIS — R69 Illness, unspecified: Secondary | ICD-10-CM | POA: Diagnosis not present

## 2015-07-25 DIAGNOSIS — S63111D Subluxation of metacarpophalangeal joint of right thumb, subsequent encounter: Secondary | ICD-10-CM | POA: Diagnosis not present

## 2015-07-25 DIAGNOSIS — Z4789 Encounter for other orthopedic aftercare: Secondary | ICD-10-CM | POA: Diagnosis not present

## 2015-08-01 DIAGNOSIS — S63111D Subluxation of metacarpophalangeal joint of right thumb, subsequent encounter: Secondary | ICD-10-CM | POA: Diagnosis not present

## 2015-08-08 DIAGNOSIS — S63111D Subluxation of metacarpophalangeal joint of right thumb, subsequent encounter: Secondary | ICD-10-CM | POA: Diagnosis not present

## 2015-08-12 NOTE — Progress Notes (Signed)
Pt never returned for labs ordered at [facility]EX.

## 2015-09-17 DIAGNOSIS — F419 Anxiety disorder, unspecified: Secondary | ICD-10-CM | POA: Diagnosis not present

## 2015-09-17 DIAGNOSIS — G43019 Migraine without aura, intractable, without status migrainosus: Secondary | ICD-10-CM | POA: Diagnosis not present

## 2015-09-17 DIAGNOSIS — G4700 Insomnia, unspecified: Secondary | ICD-10-CM | POA: Diagnosis not present

## 2015-09-23 DIAGNOSIS — H40013 Open angle with borderline findings, low risk, bilateral: Secondary | ICD-10-CM | POA: Diagnosis not present

## 2015-11-14 DIAGNOSIS — H9202 Otalgia, left ear: Secondary | ICD-10-CM | POA: Diagnosis not present

## 2015-11-14 DIAGNOSIS — Z23 Encounter for immunization: Secondary | ICD-10-CM | POA: Diagnosis not present

## 2015-11-18 DIAGNOSIS — G44219 Episodic tension-type headache, not intractable: Secondary | ICD-10-CM | POA: Diagnosis not present

## 2015-11-18 DIAGNOSIS — G4700 Insomnia, unspecified: Secondary | ICD-10-CM | POA: Diagnosis not present

## 2015-11-18 DIAGNOSIS — G43009 Migraine without aura, not intractable, without status migrainosus: Secondary | ICD-10-CM | POA: Diagnosis not present

## 2015-11-18 DIAGNOSIS — F419 Anxiety disorder, unspecified: Secondary | ICD-10-CM | POA: Diagnosis not present

## 2016-01-17 ENCOUNTER — Other Ambulatory Visit: Payer: Self-pay | Admitting: Obstetrics & Gynecology

## 2016-01-17 NOTE — Telephone Encounter (Signed)
Medication refill request: Foxamax  Last AEX:  05/07/2015 MSM Next AEX: 07/24/2016 MSM Last MMG (if hormonal medication request): 05/21/15 BIRADS Category 1 Negative Refill authorized: 05/27/15 #4 tabs 6 refills  Today: #4 tabs 6 Refills? Please advise ?

## 2016-02-17 DIAGNOSIS — G44219 Episodic tension-type headache, not intractable: Secondary | ICD-10-CM | POA: Diagnosis not present

## 2016-02-17 DIAGNOSIS — G4700 Insomnia, unspecified: Secondary | ICD-10-CM | POA: Diagnosis not present

## 2016-02-17 DIAGNOSIS — F411 Generalized anxiety disorder: Secondary | ICD-10-CM | POA: Diagnosis not present

## 2016-02-17 DIAGNOSIS — G43009 Migraine without aura, not intractable, without status migrainosus: Secondary | ICD-10-CM | POA: Diagnosis not present

## 2016-03-09 DIAGNOSIS — R0789 Other chest pain: Secondary | ICD-10-CM | POA: Diagnosis not present

## 2016-03-09 DIAGNOSIS — J329 Chronic sinusitis, unspecified: Secondary | ICD-10-CM | POA: Diagnosis not present

## 2016-04-07 ENCOUNTER — Other Ambulatory Visit: Payer: Self-pay | Admitting: *Deleted

## 2016-04-07 MED ORDER — ALENDRONATE SODIUM 70 MG PO TABS: ORAL_TABLET | ORAL | Status: DC

## 2016-04-07 NOTE — Telephone Encounter (Signed)
pharmacy sent a request for RX to changed to 90 supply

## 2016-04-22 DIAGNOSIS — H40013 Open angle with borderline findings, low risk, bilateral: Secondary | ICD-10-CM | POA: Diagnosis not present

## 2016-04-30 DIAGNOSIS — H40013 Open angle with borderline findings, low risk, bilateral: Secondary | ICD-10-CM | POA: Diagnosis not present

## 2016-04-30 DIAGNOSIS — H43813 Vitreous degeneration, bilateral: Secondary | ICD-10-CM | POA: Diagnosis not present

## 2016-04-30 DIAGNOSIS — H5203 Hypermetropia, bilateral: Secondary | ICD-10-CM | POA: Diagnosis not present

## 2016-04-30 DIAGNOSIS — H2513 Age-related nuclear cataract, bilateral: Secondary | ICD-10-CM | POA: Diagnosis not present

## 2016-07-24 ENCOUNTER — Ambulatory Visit: Payer: Medicare PPO | Admitting: Obstetrics & Gynecology

## 2016-08-04 DIAGNOSIS — J300 Vasomotor rhinitis: Secondary | ICD-10-CM | POA: Diagnosis not present

## 2016-08-04 DIAGNOSIS — G43019 Migraine without aura, intractable, without status migrainosus: Secondary | ICD-10-CM | POA: Diagnosis not present

## 2016-08-04 DIAGNOSIS — F419 Anxiety disorder, unspecified: Secondary | ICD-10-CM | POA: Diagnosis not present

## 2016-08-04 DIAGNOSIS — G4700 Insomnia, unspecified: Secondary | ICD-10-CM | POA: Diagnosis not present

## 2016-08-14 ENCOUNTER — Other Ambulatory Visit: Payer: Self-pay | Admitting: Obstetrics & Gynecology

## 2016-08-14 ENCOUNTER — Ambulatory Visit (INDEPENDENT_AMBULATORY_CARE_PROVIDER_SITE_OTHER): Payer: Medicare PPO | Admitting: Obstetrics & Gynecology

## 2016-08-14 ENCOUNTER — Encounter: Payer: Self-pay | Admitting: Obstetrics & Gynecology

## 2016-08-14 VITALS — BP 114/60 | HR 70 | Resp 14 | Ht 60.5 in | Wt 107.0 lb

## 2016-08-14 DIAGNOSIS — Z01419 Encounter for gynecological examination (general) (routine) without abnormal findings: Secondary | ICD-10-CM | POA: Diagnosis not present

## 2016-08-14 DIAGNOSIS — E2839 Other primary ovarian failure: Secondary | ICD-10-CM

## 2016-08-14 DIAGNOSIS — Z1231 Encounter for screening mammogram for malignant neoplasm of breast: Secondary | ICD-10-CM

## 2016-08-14 MED ORDER — ALENDRONATE SODIUM 70 MG PO TABS: ORAL_TABLET | ORAL | 4 refills | Status: DC

## 2016-08-14 NOTE — Progress Notes (Signed)
65 y.o. G0P0 MarriedCaucasianF here for annual exam.  Had the flu in April.  Pt reports she was really sick for 3 weeks.  As she was getting better, her mother passed away unexpected due to e coli sepsis.  Mother was 26.  She was in a skilled facility in [place].    Pt denies vaginal bleeding.   She is going on a cruise in a few weeks.  Going to the [place].    PCP:  Dr. Olen Pel     Patient's last menstrual period was 12/14/1985.          Sexually active: Yes.    The current method of family planning is post menopausal status.    Exercising: Yes.    weights cardio Smoker:  no  Health Maintenance: Pap:  05-07-15 neg, 5/14 neg pap and neg HR HPV History of abnormal Pap:  no MMG:  05-21-15 category b density birads 1:neg. Pt will schedule  Colonoscopy:  2011 f/u 60yrs BMD:   11/15 osteoporosis TDaP:  2012 Pneumonia vaccine(s): Order given for prevnar to do at local pharmacy Zostavax:   No Hep C testing: had physical for life insurance Screening Labs: PCP   reports that she quit smoking about 30 years ago. Her smoking use included Cigarettes. She started smoking about 36 years ago. She has a 3.50 pack-year smoking history. She has never used smokeless tobacco. She reports that she does not drink alcohol or use drugs.  Past Medical History:  Diagnosis Date  . Anxiety   . Arthritis   . Bursitis   . Depression   . Headache(784.0)   . Osteoporosis   . Thrombocytopenia ([place]) 11/26/2011    Past Surgical History:  Procedure Laterality Date  . ANAL FISSURE REPAIR    . BUNIONECTOMY     both feet  . COLONOSCOPY    . HAND SURGERY Right 04-17-15   thumb surgery  . HARDWARE REMOVAL  04/14/2012   Procedure: HARDWARE REMOVAL;  Surgeon: Linna Hoff, MD;  Location: [place];  Service: Orthopedics;  Laterality: Left;  left wrist deep hardware excision  . WRIST ARTHROPLASTY  2011   left     Current Outpatient Prescriptions  Medication Sig Dispense Refill  .  alendronate (FOSAMAX) 70 MG tablet TAKE 1 TABLET BY MOUTH EVERY 7 DAYS. TAKE WITH FULL GLASS OF WATER ON EMPTY STOMACH 12 tablet 1  . Calcium Carbonate-Vitamin D (CALCIUM + D PO) Take 1,200 mg by mouth every morning.     . Cholecalciferol (VITAMIN D3) 1000 UNITS CAPS Take by mouth daily.    . citalopram (CELEXA) 40 MG tablet Take 20 mg by mouth Daily.     . clonazePAM (KLONOPIN) 1 MG tablet Take 1 mg by mouth Daily.    . frovatriptan (FROVA) 2.5 MG tablet Take 2.5 mg by mouth as needed. may repeat after 2 hours. Max of 3 tabs in 24 hours.    . Magnesium 500 MG TABS Take by mouth daily.    . Melatonin 10 MG TABS Take by mouth at bedtime.    . promethazine (PHENERGAN) 12.5 MG tablet Take 12.5 mg by mouth every 6 (six) hours as needed.       No current facility-administered medications for this visit.     Family History  Problem Relation Age of Onset  . Diabetes Mother   . Hypertension Mother   . Stroke Mother   . Dementia Mother   . Heart disease Father   . Cancer  Father     lung cancer    ROS:  Pertinent items are noted in HPI.  Otherwise, a comprehensive ROS was negative.  Exam:   BP 114/60 (BP Location: Right Arm, Patient Position: Sitting, Cuff Size: Normal)   Pulse 70   Resp 14   Ht 5' 0.5" (1.537 m)   Wt 107 lb (48.5 kg)   LMP 12/14/1985   BMI 20.55 kg/m   Weight change: @WEIGHTCHANGE @ Height:   Height: 5' 0.5" (153.7 cm)  Ht Readings from Last 3 Encounters:  08/14/16 5' 0.5" (1.537 m)  05/07/15 5' 0.5" (1.537 m)  04/30/14 5\' 1"  (1.549 m)    General appearance: alert, cooperative and appears stated age Head: Normocephalic, without obvious abnormality, atraumatic Neck: no adenopathy, supple, symmetrical, trachea midline and thyroid normal to inspection and palpation Lungs: clear to auscultation bilaterally Breasts: normal appearance, no masses or tenderness Heart: regular rate and rhythm Abdomen: soft, non-tender; bowel sounds normal; no masses,  no  organomegaly Extremities: extremities normal, atraumatic, no cyanosis or edema Skin: Skin color, texture, turgor normal. No rashes or lesions Lymph nodes: Cervical, supraclavicular, and axillary nodes normal. No abnormal inguinal nodes palpated Neurologic: Grossly normal   Pelvic: External genitalia:  no lesions              Urethra:  normal appearing urethra with no masses, tenderness or lesions              Bartholins and Skenes: normal                 Vagina: normal appearing vagina with normal color and discharge, no lesions              Cervix: no lesions              Pap taken: No. Bimanual Exam:  Uterus:  normal size, contour, position, consistency, mobility, non-tender              Adnexa: normal adnexa and no mass, fullness, tenderness               Rectovaginal: Confirms               Anus:  normal sphincter tone, no lesions  Chaperone was present for exam.  A:  Well Woman with normal exam PMP, no HRT H/O thrombocytopenia and leukopenia, released from Dr. Marin Olp 2015 Osteoporosis Migraines followed by Dr. Catalina Gravel, neurology  P: Mammogram due. BMD will be scheduled with MMG in November Order for Prevnar vaccine given.  Will give rx for pneumovax next year as well. No pap obtained today.  Hx neg pap 2016 and neg HR HPV 2014. RF for Fosamax 70 mg weekly.  #12/4RF She will send a copy of recent lab work done for insurance to make sure Hep C testing was done. Return annually or prn

## 2016-09-29 DIAGNOSIS — D696 Thrombocytopenia, unspecified: Secondary | ICD-10-CM | POA: Diagnosis not present

## 2016-09-29 DIAGNOSIS — G4700 Insomnia, unspecified: Secondary | ICD-10-CM | POA: Diagnosis not present

## 2016-09-29 DIAGNOSIS — G43019 Migraine without aura, intractable, without status migrainosus: Secondary | ICD-10-CM | POA: Diagnosis not present

## 2016-09-29 DIAGNOSIS — F419 Anxiety disorder, unspecified: Secondary | ICD-10-CM | POA: Diagnosis not present

## 2016-10-16 DIAGNOSIS — Z23 Encounter for immunization: Secondary | ICD-10-CM | POA: Diagnosis not present

## 2016-10-20 IMAGING — MG MM SCREEN MAMMOGRAM BILATERAL
4 series · 4 of 4 positions shown · non-contrast
Comparison: Previous exam(s).

CLINICAL DATA: Screening.

EXAM:
DIGITAL SCREENING BILATERAL MAMMOGRAM WITH CAD

[R CC]
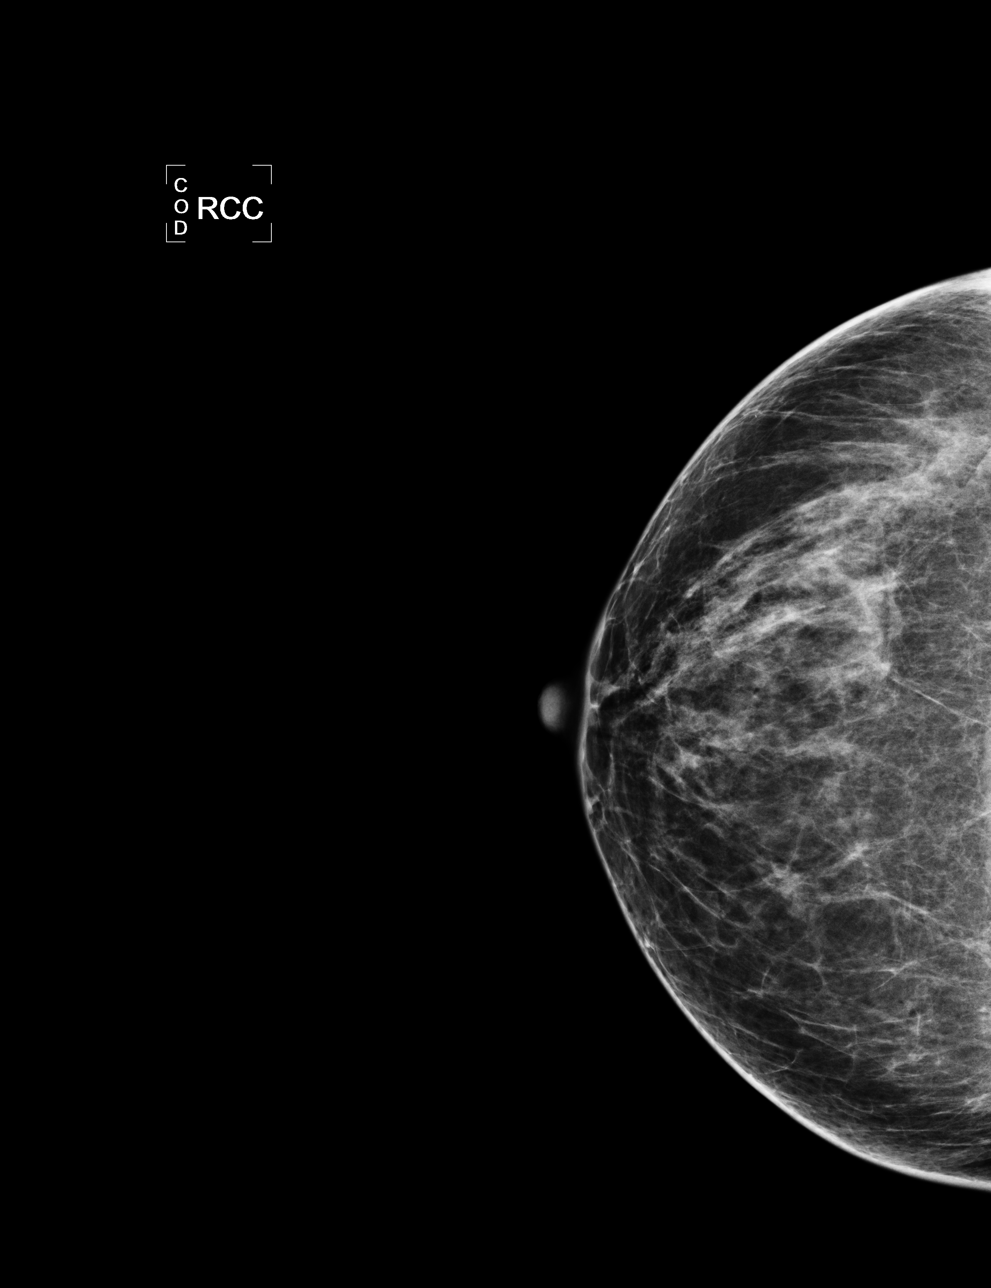

[L CC]
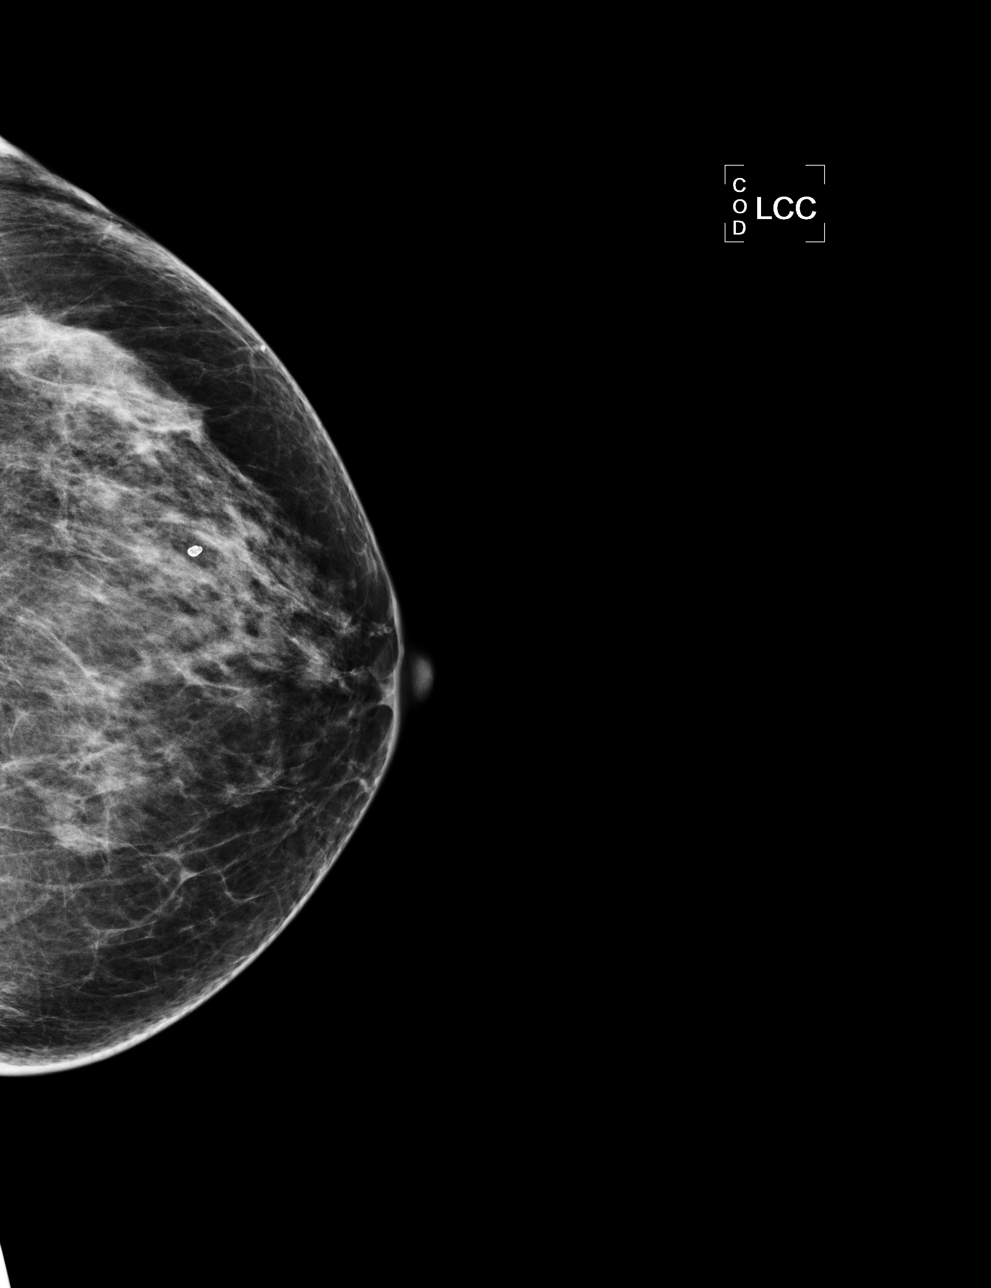

[L MLO]
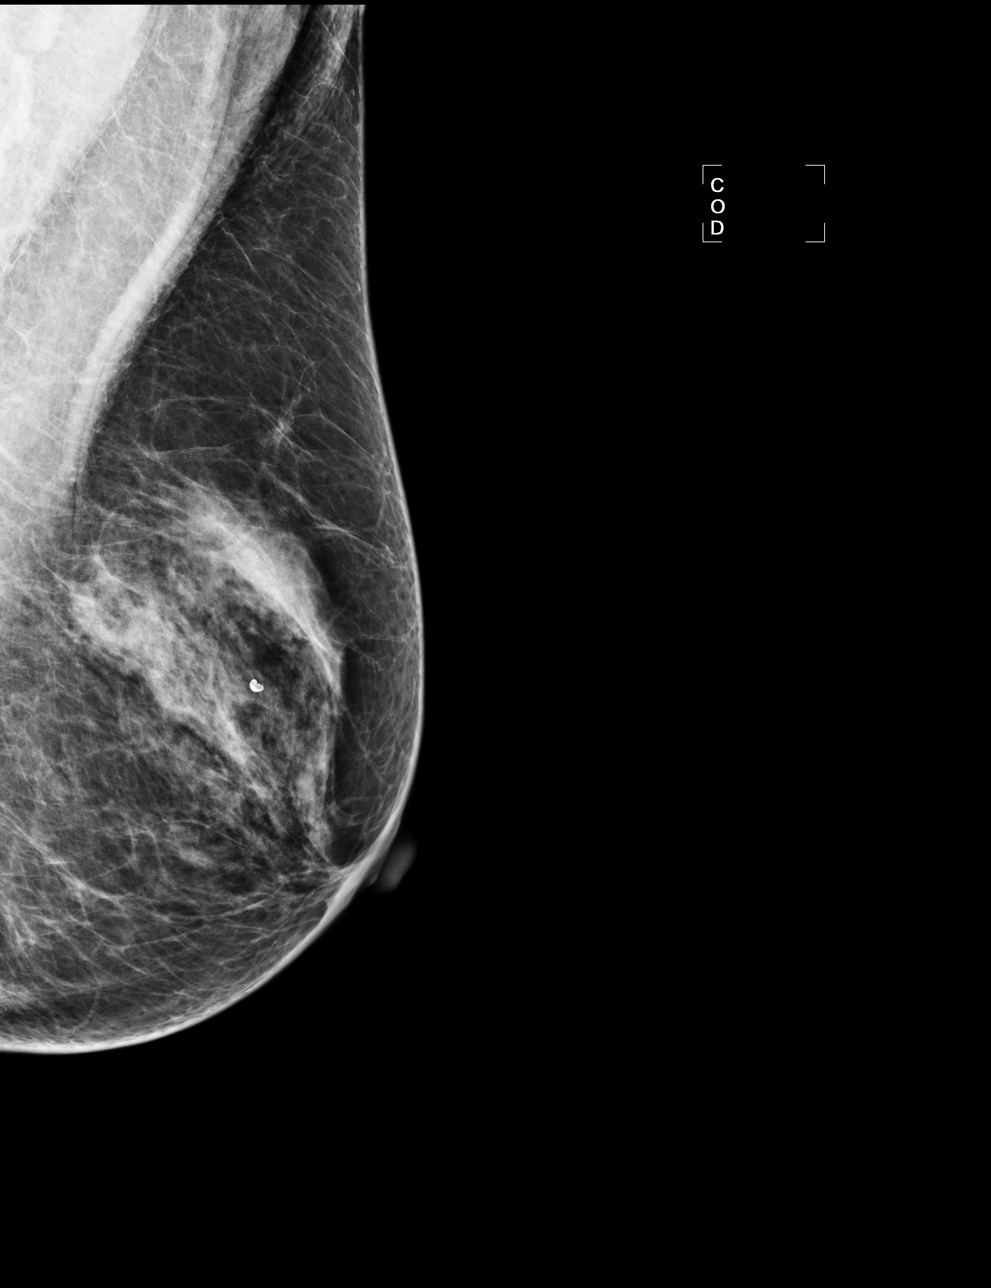

[R MLO]
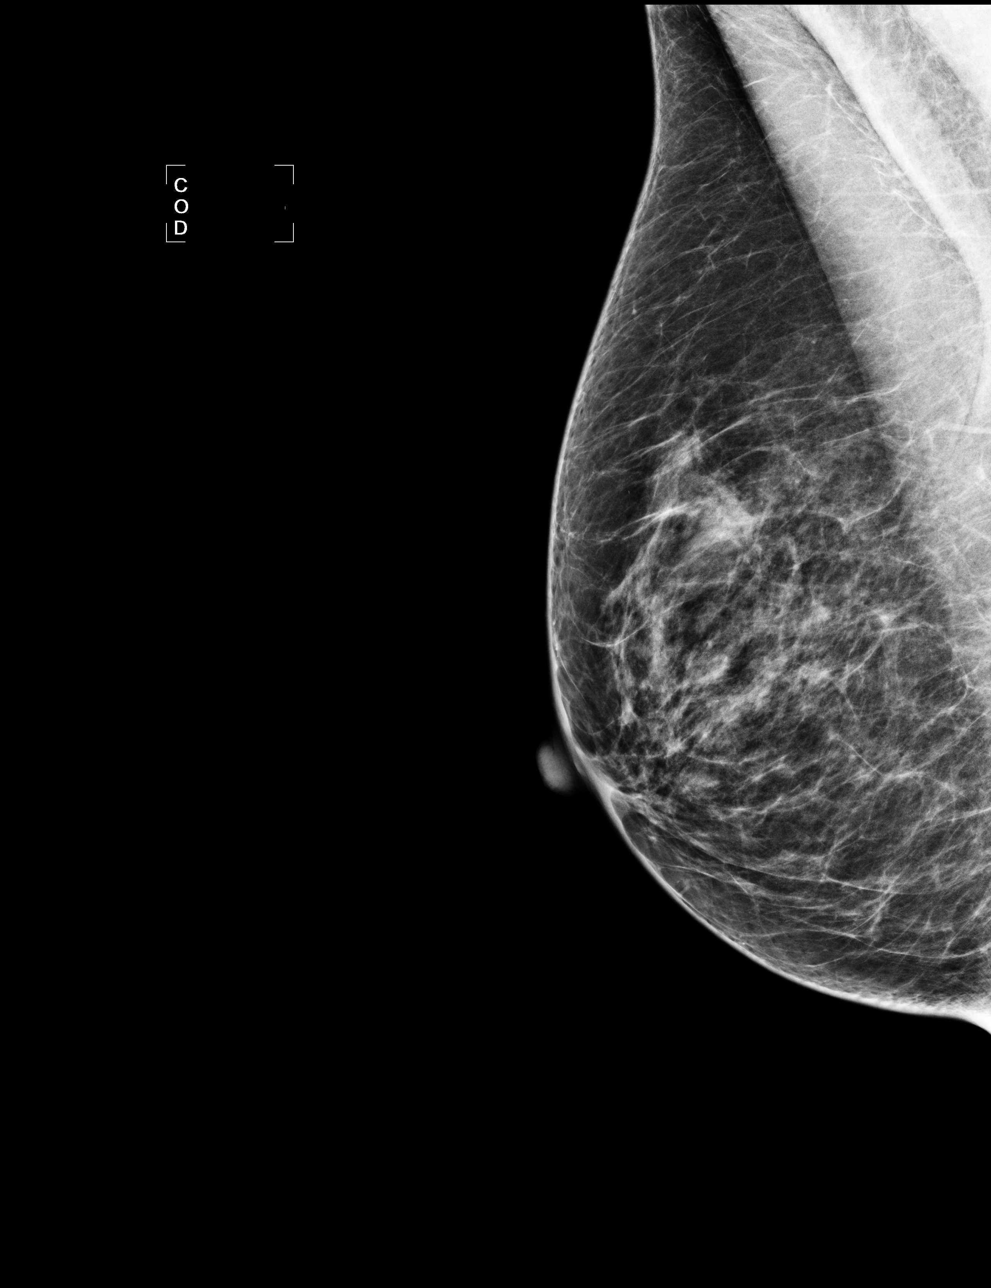

[4 of 4 positions shown; findings below may reference images not displayed]

ACR Breast Density Category b: There are scattered areas of
fibroglandular density.
FINDINGS: There are no findings suspicious for malignancy. Images were
processed with CAD.
IMPRESSION: No mammographic evidence of malignancy. A result letter of this
screening mammogram will be mailed directly to the patient.

RECOMMENDATION:
Screening mammogram in one year. (Code:AS-G-LCT)

BI-RADS CATEGORY  1: Negative.

## 2016-11-01 DIAGNOSIS — R05 Cough: Secondary | ICD-10-CM | POA: Diagnosis not present

## 2016-11-01 DIAGNOSIS — B349 Viral infection, unspecified: Secondary | ICD-10-CM | POA: Diagnosis not present

## 2016-11-02 ENCOUNTER — Ambulatory Visit
Admission: RE | Admit: 2016-11-02 | Discharge: 2016-11-02 | Disposition: A | Discharge status: Home / Self Care | Payer: Medicare PPO | Source: Ambulatory Visit | Attending: Obstetrics & Gynecology | Admitting: Obstetrics & Gynecology

## 2016-11-02 DIAGNOSIS — M810 Age-related osteoporosis without current pathological fracture: Secondary | ICD-10-CM | POA: Diagnosis not present

## 2016-11-02 DIAGNOSIS — Z1231 Encounter for screening mammogram for malignant neoplasm of breast: Secondary | ICD-10-CM

## 2016-11-02 DIAGNOSIS — Z780 Asymptomatic menopausal state: Secondary | ICD-10-CM | POA: Diagnosis not present

## 2016-11-04 DIAGNOSIS — J069 Acute upper respiratory infection, unspecified: Secondary | ICD-10-CM | POA: Diagnosis not present

## 2016-11-19 ENCOUNTER — Telehealth: Payer: Self-pay | Admitting: *Deleted

## 2016-11-19 NOTE — Telephone Encounter (Signed)
Message left with patient's husband- on DPR- to return call to [place] at [phone].

## 2016-11-19 NOTE — Telephone Encounter (Signed)
-----   Message from Megan Salon, MD sent at 11/17/2016  6:09 AM EST ----- Please let pt know BMD changed from prior one with a t score of -2.5 to -2.6.  She is on foxamax.  Probably needs consult to discuss if we should change treatment for her.  Thanks.

## 2016-11-19 NOTE — Telephone Encounter (Signed)
Patient returning call.

## 2016-11-19 NOTE — Telephone Encounter (Signed)
Returned call to patient. BMD results reviewed with patient and she verbalized understanding. Patient asking for specific location of change? Dr. Sabra Heck- please advise. Patient requesting a return call once Dr. Sabra Heck reviews. Patient requesting to schedule BMD consult in January. Consult scheduled for Tuesday 12/22/16 at 1500. Patient agreeable to date and time of appointment.   Routing to provider for review.

## 2016-11-25 NOTE — Telephone Encounter (Signed)
It was the right femoral neck--typically the weakest place on the femur bone.  We will go over her last several bone density tests when she comes for her consult, just so she knows.  Thanks.

## 2016-11-26 NOTE — Telephone Encounter (Signed)
Call to patient. Message given to patient as seen below from Dr. Sabra Heck. Patient verbalized understanding. Has consult scheduled for Tuesday 12/22/16.    Routing to provider for final review. Patient agreeable to disposition. Will close encounter.

## 2016-12-22 ENCOUNTER — Ambulatory Visit (INDEPENDENT_AMBULATORY_CARE_PROVIDER_SITE_OTHER): Payer: Medicare Other | Admitting: Obstetrics & Gynecology

## 2016-12-22 VITALS — BP 100/60 | HR 62 | Resp 12 | Ht 60.5 in | Wt 106.4 lb

## 2016-12-22 DIAGNOSIS — R5382 Chronic fatigue, unspecified: Secondary | ICD-10-CM

## 2016-12-22 DIAGNOSIS — M810 Age-related osteoporosis without current pathological fracture: Secondary | ICD-10-CM

## 2016-12-22 DIAGNOSIS — Z79899 Other long term (current) drug therapy: Secondary | ICD-10-CM

## 2016-12-22 DIAGNOSIS — D696 Thrombocytopenia, unspecified: Secondary | ICD-10-CM | POA: Diagnosis not present

## 2016-12-22 LAB — CBC
HEMATOCRIT: 39.2 % (ref 35.0–45.0)
HEMOGLOBIN: 13 g/dL (ref 11.7–15.5)
MCH: 31.3 pg (ref 27.0–33.0)
MCHC: 33.2 g/dL (ref 32.0–36.0)
MCV: 94.5 fL (ref 80.0–100.0)
MPV: 10.1 fL (ref 7.5–12.5)
Platelets: 120 10*3/uL — ABNORMAL LOW (ref 140–400)
RBC: 4.15 MIL/uL (ref 3.80–5.10)
RDW: 13.6 % (ref 11.0–15.0)
WBC: 3.3 10*3/uL — ABNORMAL LOW (ref 3.8–10.8)

## 2016-12-22 LAB — TSH: TSH: 2.36 mIU/L

## 2016-12-22 MED ORDER — LEVETIRACETAM 500 MG PO TABS: 500.0000 mg | ORAL_TABLET | Freq: Every day | ORAL | Status: DC

## 2016-12-22 NOTE — Progress Notes (Signed)
Subjective:    33 yrs Married Caucasian G0P0  female here to discuss recent BMD obtained 11/02/16 showing osteoporosis with T score -2.6 in right femoral neck.  T score in spine is -1.8.  Prior BMD was 10/30/2014 showign t score -1.3 in spine and left femoral neck t score of -2.5.  Reviewed current and prior BMD with pt.  Osteoporosis Risk Factors  Nonmodifiable Personal Hx of fracture as an adult: no Hx of fracture in first-degree relative: yes - mother with hip fracutre Caucasian race: yes Advanced age: no Female sex: yes Dementia: no Poor health/frailty: no  Potentially modifiable: Tobacco use: no Low body weight (<127 lbs): yes Estrogen deficiency  early menopause (age <45) or bilateral ovariectomy: no  prolonged premenopausal amenorrhea (>1 yr): no Low calcium intake (lifelong): no Alcohol use more than 2 drinks per day: no Recurrent falls: no Inadequate physical activity: no  Current calcium and Vit D intake: no calcium due to intolerance, takes Vit D and magnesium  Review of Systems A comprehensive review of systems was negative.     Objective:   PHYSICAL EXAM BP 100/60 (BP Location: Right Arm, Patient Position: Sitting, Cuff Size: Small)   Pulse 62   Resp 12   Ht 5' 0.5" (1.537 m)   Wt 106 lb 6.4 oz (48.3 kg)   LMP 12/14/1985   BMI 20.44 kg/m  General appearance: alert, cooperative and no distress  Imaging Bone Density: Spine T Score: -1.8, Hip T Score: -2.5.  FRAX score not calculated.    Discussion:  Reviewed current and prior BMD.  She is on Foxamax and tolerating this well.  D/w pt that BMD is worsening and this is not adequate response to therapy.  Reviewed other options including Prolia, Evista and IV bisphosphonate.  She does not feel that any other option is appropriate for her.  She prefers to stay on fosamax and repeat BMD in two years.  Pt does have inadequate calcium intake.  We discussed ways to improve this with food sources.  She states she  will work diligently to try and improve this intake.                                           Assessment:   Osteoporosis with T score -2.6 in hip Thrombocytopenia, needs CBC obtained today if possible.  This is being followed by her PCP.   Plan:   1.  CBC, PTH with intact calcium, TSH, and Vit D obtained today.  (CBC needs to be sent to her PCP, per her request.) 2.  As long as the above are normal, pt does not want to proceed with any treatments at this time. 3.  She is aware to try and maximize calcium and Vit D in food sources.  Guideline recommendations given. 4.  Will plan to repeat BMD in two years  ~25 minutes spent with patient >50% of time was in face to face discussion of above.

## 2016-12-23 LAB — PTH, INTACT AND CALCIUM
CALCIUM: 9.5 mg/dL (ref 8.6–10.4)
PTH: 34 pg/mL (ref 14–64)

## 2016-12-23 LAB — VITAMIN D 25 HYDROXY (VIT D DEFICIENCY, FRACTURES): Vit D, 25-Hydroxy: 42 ng/mL (ref 30–100)

## 2016-12-28 ENCOUNTER — Telehealth: Payer: Self-pay | Admitting: *Deleted

## 2016-12-28 NOTE — Telephone Encounter (Signed)
Message left to return call to Violeta Lecount at [phone].    

## 2016-12-28 NOTE — Telephone Encounter (Signed)
-----   Message from Megan Salon, MD sent at 12/28/2016  6:37 AM EST ----- Inform pt that her PTH, calcium level, TSH and Vit D were all normal.  We decided to just repeat a bone density in two years.  Also, her CBC showed her platelet count at 120 (a little higher than it has been).  Her WBC ct is stable at 3.3.  This is being followed by another provider and she will want this faxed to them.  Please ask her about this.  Thanks.

## 2016-12-28 NOTE — Telephone Encounter (Signed)
Patient returned call. All results reviewed with patient and she verbalized understanding. Patient requests labs be sent to Dr. Michel Santee. Verbal release of records filled out and taken to [facility] for processing.   Routing to provider for final review. Patient agreeable to disposition. Will close encounter.

## 2017-01-03 ENCOUNTER — Encounter: Payer: Self-pay | Admitting: Obstetrics & Gynecology

## 2017-01-03 DIAGNOSIS — M810 Age-related osteoporosis without current pathological fracture: Secondary | ICD-10-CM | POA: Insufficient documentation

## 2017-04-20 ENCOUNTER — Other Ambulatory Visit: Payer: Self-pay | Admitting: Neurology

## 2017-04-20 DIAGNOSIS — R410 Disorientation, unspecified: Secondary | ICD-10-CM

## 2017-04-20 DIAGNOSIS — G4452 New daily persistent headache (NDPH): Secondary | ICD-10-CM

## 2017-05-04 ENCOUNTER — Ambulatory Visit
Admission: RE | Admit: 2017-05-04 | Discharge: 2017-05-04 | Disposition: A | Discharge status: Home / Self Care | Payer: Medicare Other | Source: Ambulatory Visit | Attending: Neurology | Admitting: Neurology

## 2017-05-04 DIAGNOSIS — R410 Disorientation, unspecified: Secondary | ICD-10-CM

## 2017-05-04 DIAGNOSIS — G4452 New daily persistent headache (NDPH): Secondary | ICD-10-CM

## 2017-09-07 ENCOUNTER — Other Ambulatory Visit: Payer: Self-pay | Admitting: Obstetrics & Gynecology

## 2017-09-07 NOTE — Telephone Encounter (Signed)
Medication refill request:Fosamax Last AEX:  08-14-16  Next AEX: 11-19-17  Last MMG (if hormonal medication request): 11-02-16 WNL DEXA: 11-02-16 osteoporotic Refill authorized: please advise

## 2017-11-19 ENCOUNTER — Other Ambulatory Visit: Payer: Self-pay

## 2017-11-19 ENCOUNTER — Encounter: Payer: Self-pay | Admitting: Obstetrics & Gynecology

## 2017-11-19 ENCOUNTER — Other Ambulatory Visit (HOSPITAL_COMMUNITY)
Admission: RE | Admit: 2017-11-19 | Discharge: 2017-11-19 | Disposition: A | Discharge status: Home / Self Care | Payer: Medicare Other | Source: Ambulatory Visit | Attending: Obstetrics & Gynecology | Admitting: Obstetrics & Gynecology

## 2017-11-19 ENCOUNTER — Ambulatory Visit (INDEPENDENT_AMBULATORY_CARE_PROVIDER_SITE_OTHER): Payer: Medicare Other | Admitting: Obstetrics & Gynecology

## 2017-11-19 VITALS — BP 124/72 | HR 60 | Resp 16 | Ht 60.25 in | Wt 108.0 lb

## 2017-11-19 DIAGNOSIS — G43709 Chronic migraine without aura, not intractable, without status migrainosus: Secondary | ICD-10-CM | POA: Diagnosis not present

## 2017-11-19 DIAGNOSIS — R6889 Other general symptoms and signs: Secondary | ICD-10-CM | POA: Diagnosis not present

## 2017-11-19 DIAGNOSIS — D696 Thrombocytopenia, unspecified: Secondary | ICD-10-CM

## 2017-11-19 DIAGNOSIS — R5382 Chronic fatigue, unspecified: Secondary | ICD-10-CM

## 2017-11-19 DIAGNOSIS — R1032 Left lower quadrant pain: Secondary | ICD-10-CM | POA: Diagnosis not present

## 2017-11-19 DIAGNOSIS — Z124 Encounter for screening for malignant neoplasm of cervix: Secondary | ICD-10-CM

## 2017-11-19 DIAGNOSIS — Z01419 Encounter for gynecological examination (general) (routine) without abnormal findings: Secondary | ICD-10-CM

## 2017-11-19 DIAGNOSIS — G43909 Migraine, unspecified, not intractable, without status migrainosus: Secondary | ICD-10-CM | POA: Insufficient documentation

## 2017-11-19 MED ORDER — ALENDRONATE SODIUM 70 MG PO TABS: 70.0000 mg | ORAL_TABLET | ORAL | 4 refills | Status: DC

## 2017-11-19 NOTE — Addendum Note (Signed)
Addended by: Burnice Logan on: 11/19/2017 03:29 PM   Modules accepted: Orders

## 2017-11-19 NOTE — Progress Notes (Signed)
Patient scheduled while in office for PUS on 12/09/17 at 12:30pm with consult to follow at 1pm with Dr. Sabra Heck. Patient verbalizes understanding and is agreeable.  Order placed for PUS.

## 2017-11-19 NOTE — Progress Notes (Signed)
66 y.o. G0P0 MarriedCaucasianF here for annual exam.  Doing well.  Seeing new neurologist at [facility].  Has made some changes after seeing neurologist.  Doing to try Botox and will start in January.    Denies vaginal bleeding.  Having LLQ pain for several months.  Has chronic constipation.  This hasn't changed.  Would like to have some evaluation.  PCP:  Dr. Orpah Melter  Patient's last menstrual period was 12/14/1985.          Sexually active: Yes.    The current method of family planning is post menopausal status.    Exercising: Yes.    weights, treadmill Smoker:  no  Health Maintenance: Pap:   05-07-15 neg, 5/14 neg pap and neg HR HPV History of abnormal Pap:  no MMG:  11/02/16 BIRADS 1 negative/density b Colonoscopy:  2011 f/u 9yrs BMD:   11/17 osteoporosis TDaP: 09/07/17 Pneumonia vaccine(s):  10/16/16 Zostavax:   None.  Declines for now. Hep C testing: possibly done for insurance Screening Labs: PCP   reports that she quit smoking about 31 years ago. Her smoking use included cigarettes. She started smoking about 38 years ago. She has a 3.50 pack-year smoking history. she has never used smokeless tobacco. She reports that she does not drink alcohol or use drugs.  Past Medical History:  Diagnosis Date  . Anxiety   . Arthritis   . Bursitis   . Depression   . Headache(784.0)   . Osteoporosis   . Thrombocytopenia ([place]) 11/26/2011    Past Surgical History:  Procedure Laterality Date  . ANAL FISSURE REPAIR    . BUNIONECTOMY     both feet  . COLONOSCOPY    . HAND SURGERY Right 04-17-15   thumb surgery  . HARDWARE REMOVAL  04/14/2012   Procedure: HARDWARE REMOVAL;  Surgeon: Linna Hoff, MD;  Location: [place];  Service: Orthopedics;  Laterality: Left;  left wrist deep hardware excision  . WRIST ARTHROPLASTY  2011   left     Current Outpatient Medications  Medication Sig Dispense Refill  . alendronate (FOSAMAX) 70 MG tablet TAKE 1 TABLET BY MOUTH EVERY  7 DAYS. TAKE WITH FULL GLASS OF WATER ON EMPTY STOMACH 12 tablet 0  . baclofen (LIORESAL) 10 MG tablet Take 1-2 tabs (10-20mg ) PO every 6-8 hours PRN up to TID for pain.    . Cholecalciferol (VITAMIN D3) 1000 UNITS CAPS Take by mouth daily.    . citalopram (CELEXA) 40 MG tablet Take 20 mg by mouth Daily.     . clonazePAM (KLONOPIN) 1 MG tablet Take 1 mg by mouth Daily.    . Magnesium 500 MG TABS Take by mouth daily.    . Melatonin 10 MG TABS Take by mouth at bedtime.    . promethazine (PHENERGAN) 12.5 MG tablet Take 12.5 mg by mouth every 6 (six) hours as needed.      . SUMAtriptan (IMITREX) 100 MG tablet TAKE 1 TABLET BY MOUTH FOR MIGRAINE.MAY REPEAT IN 2 HRS. NO MORE THAN 2TABS IN 24HRS OR 2-3DAYS/WK  1   No current facility-administered medications for this visit.     Family History  Problem Relation Age of Onset  . Diabetes Mother   . Hypertension Mother   . Stroke Mother   . Dementia Mother   . Heart disease Father   . Cancer Father        lung cancer    ROS:  Pertinent items are noted in HPI.  Otherwise,  a comprehensive ROS was negative.  Exam:   BP 124/72 (BP Location: Right Arm, Patient Position: Sitting, Cuff Size: Normal)   Pulse 60   Resp 16   Ht 5' 0.25" (1.53 m)   Wt 108 lb (49 kg)   LMP 12/14/1985   BMI 20.92 kg/m   Weight change: -1#  Height: 5' 0.25" (153 cm)  Ht Readings from Last 3 Encounters:  11/19/17 5' 0.25" (1.53 m)  12/22/16 5' 0.5" (1.537 m)  08/14/16 5' 0.5" (1.537 m)   General appearance: alert, cooperative and appears stated age Head: Normocephalic, without obvious abnormality, atraumatic Neck: no adenopathy, supple, symmetrical, trachea midline and thyroid normal to inspection and palpation Lungs: clear to auscultation bilaterally Breasts: normal appearance, no masses or tenderness Heart: regular rate and rhythm Abdomen: soft, non-tender; bowel sounds normal; no masses,  no organomegaly Extremities: extremities normal, atraumatic, no  cyanosis or edema Skin: Skin color, texture, turgor normal. No rashes or lesions Lymph nodes: Cervical, supraclavicular, and axillary nodes normal. No abnormal inguinal nodes palpated Neurologic: Grossly normal   Pelvic: External genitalia:  no lesions              Urethra:  normal appearing urethra with no masses, tenderness or lesions              Bartholins and Skenes: normal                 Vagina: normal appearing vagina with normal color and discharge, no lesions              Cervix: no lesions              Pap taken: Yes.   Bimanual Exam:  Uterus:  normal size, contour, position, consistency, mobility, non-tender              Adnexa: normal adnexa and no mass, fullness, tenderness               Rectovaginal: Confirms               Anus:  normal sphincter tone, no lesions  Chaperone was present for exam.  A:  Well Woman with normal exam PMP, no HRT H/O thrombocytopenia and leukopenia, followed by Dr. Marin Olp for several years.  Released in 2015. Osteoporosis and love VIt D Migraines LLQ pain  P:   Mammogram guidelines reviewed pap smear obtained today Plan BMD again next year TSH, Vit D, CBC, CMP obtained today RF for Foxamax 70mg  weekly.  #12/4RF Return for PUS Return annually or prn

## 2017-11-20 LAB — CBC
HEMATOCRIT: 39.1 % (ref 34.0–46.6)
Hemoglobin: 12.9 g/dL (ref 11.1–15.9)
MCH: 31.6 pg (ref 26.6–33.0)
MCHC: 33 g/dL (ref 31.5–35.7)
MCV: 96 fL (ref 79–97)
PLATELETS: 126 10*3/uL — AB (ref 150–379)
RBC: 4.08 x10E6/uL (ref 3.77–5.28)
RDW: 13.2 % (ref 12.3–15.4)
WBC: 4.1 10*3/uL (ref 3.4–10.8)

## 2017-11-20 LAB — COMPREHENSIVE METABOLIC PANEL
A/G RATIO: 1.7 (ref 1.2–2.2)
ALT: 21 IU/L (ref 0–32)
AST: 34 IU/L (ref 0–40)
Albumin: 4.4 g/dL (ref 3.6–4.8)
Alkaline Phosphatase: 51 IU/L (ref 39–117)
BILIRUBIN TOTAL: 0.5 mg/dL (ref 0.0–1.2)
BUN / CREAT RATIO: 11 — AB (ref 12–28)
BUN: 11 mg/dL (ref 8–27)
CHLORIDE: 102 mmol/L (ref 96–106)
CO2: 26 mmol/L (ref 20–29)
Calcium: 9.3 mg/dL (ref 8.7–10.3)
Creatinine, Ser: 0.96 mg/dL (ref 0.57–1.00)
GFR calc non Af Amer: 61 mL/min/{1.73_m2} (ref >59)
GFR, EST AFRICAN AMERICAN: 70 mL/min/{1.73_m2} (ref >59)
GLOBULIN, TOTAL: 2.6 g/dL (ref 1.5–4.5)
Glucose: 80 mg/dL (ref 65–99)
POTASSIUM: 4.1 mmol/L (ref 3.5–5.2)
SODIUM: 141 mmol/L (ref 134–144)
TOTAL PROTEIN: 7 g/dL (ref 6.0–8.5)

## 2017-11-20 LAB — VITAMIN D 25 HYDROXY (VIT D DEFICIENCY, FRACTURES): Vit D, 25-Hydroxy: 51.3 ng/mL (ref 30.0–100.0)

## 2017-11-20 LAB — TSH: TSH: 2 u[IU]/mL (ref 0.450–4.500)

## 2017-11-23 LAB — CYTOLOGY - PAP: DIAGNOSIS: NEGATIVE

## 2017-12-09 ENCOUNTER — Other Ambulatory Visit: Payer: Self-pay

## 2017-12-09 ENCOUNTER — Other Ambulatory Visit: Payer: Self-pay | Admitting: Obstetrics & Gynecology

## 2017-12-15 ENCOUNTER — Telehealth: Payer: Self-pay | Admitting: Obstetrics & Gynecology

## 2017-12-15 NOTE — Telephone Encounter (Signed)
Call placed to patient to review benefits for scheduled appointment on 12/16/17.  Left voicemail message requesting a return call.

## 2017-12-16 ENCOUNTER — Ambulatory Visit: Payer: Medicare HMO | Admitting: Obstetrics & Gynecology

## 2017-12-16 ENCOUNTER — Ambulatory Visit (INDEPENDENT_AMBULATORY_CARE_PROVIDER_SITE_OTHER): Payer: Medicare HMO

## 2017-12-16 VITALS — BP 108/70 | HR 60 | Resp 14 | Ht 60.25 in | Wt 108.0 lb

## 2017-12-16 DIAGNOSIS — R1032 Left lower quadrant pain: Secondary | ICD-10-CM | POA: Diagnosis not present

## 2017-12-16 DIAGNOSIS — R935 Abnormal findings on diagnostic imaging of other abdominal regions, including retroperitoneum: Secondary | ICD-10-CM

## 2017-12-16 DIAGNOSIS — N858 Other specified noninflammatory disorders of uterus: Secondary | ICD-10-CM | POA: Diagnosis not present

## 2017-12-16 NOTE — Progress Notes (Signed)
GYNECOLOGY  VISIT  CC:   Review ultrasound results  HPI: 67 y.o. G0P0 Married Caucasian female here for ultrsaound due to LLQ pain that started over the past few months.  Has no bladder or bowel changes.  Decided to proceed with ultrasound to evaluate ovaries.  Denies PMP bleeding.  Ultrasound results: Uterus:  4.9 x 2.8 x 1.6cm Endometrium:  3.17mm, with cystic spaces within Left ovary:  1.3 x 1.0 x 0.7cm Right ovary: 2.1 x 0.6 x 0.7cm Cul de sac:  No free fluid  Findings reviewed.  Due to irregular appearance of endoemtrium, biopsy vs hysteroscopy recommended.  Pt desired to proceed with biopsy.  GYNECOLOGIC HISTORY: Patient's last menstrual period was 12/14/1985. Contraception: PMP Menopausal hormone therapy: none  Patient Active Problem List   Diagnosis Date Noted  . Migraine 11/19/2017  . Osteoporosis 01/03/2017  . Thrombocytopenia ([place]) 11/26/2011    Past Medical History:  Diagnosis Date  . Anxiety   . Arthritis   . Bursitis   . Depression   . Headache(784.0)   . Osteoporosis   . Thrombocytopenia ([place]) 11/26/2011    Past Surgical History:  Procedure Laterality Date  . ANAL FISSURE REPAIR    . BUNIONECTOMY     both feet  . COLONOSCOPY    . HAND SURGERY Right 04-17-15   thumb surgery  . HARDWARE REMOVAL  04/14/2012   Procedure: HARDWARE REMOVAL;  Surgeon: Linna Hoff, MD;  Location: [facility];  Service: Orthopedics;  Laterality: Left;  left wrist deep hardware excision  . WRIST ARTHROPLASTY  2011   left     MEDS:   Current Outpatient Medications on File Prior to Visit  Medication Sig Dispense Refill  . alendronate (FOSAMAX) 70 MG tablet Take 1 tablet (70 mg total) by mouth once a week. Take with a full glass of water on an empty stomach. 12 tablet 4  . baclofen (LIORESAL) 10 MG tablet Take 1-2 tabs (10-20mg ) PO every 6-8 hours PRN up to TID for pain.    . Cholecalciferol (VITAMIN D3) 1000 UNITS CAPS Take by mouth daily.    . citalopram  (CELEXA) 40 MG tablet Take 20 mg by mouth Daily.     . clonazePAM (KLONOPIN) 1 MG tablet Take 1 mg by mouth Daily.    . Magnesium 500 MG TABS Take by mouth daily.    . Melatonin 10 MG TABS Take by mouth at bedtime.    . promethazine (PHENERGAN) 12.5 MG tablet Take 12.5 mg by mouth every 6 (six) hours as needed.      . SUMAtriptan (IMITREX) 100 MG tablet TAKE 1 TABLET BY MOUTH FOR MIGRAINE.MAY REPEAT IN 2 HRS. NO MORE THAN 2TABS IN 24HRS OR 2-3DAYS/WK  1   No current facility-administered medications on file prior to visit.     ALLERGIES: Corticosteroids; Levonorgestrel-ethinyl estrad; Cortisone; and Latex  Family History  Problem Relation Age of Onset  . Diabetes Mother   . Hypertension Mother   . Stroke Mother   . Dementia Mother   . Heart disease Father   . Cancer Father        lung cancer    SH:  Married, non smoker  Review of Systems  Constitutional: Negative.   Respiratory: Negative.   Cardiovascular: Negative.   Gastrointestinal: Positive for abdominal pain (llq).  Genitourinary: Negative.     PHYSICAL EXAMINATION:    BP 108/70 (BP Location: Right Arm, Patient Position: Sitting, Cuff Size: Normal)   Pulse 60  Resp 14   Ht 5' 0.25" (1.53 m)   Wt 108 lb (49 kg)   LMP 12/14/1985   BMI 20.92 kg/m     General appearance: alert, cooperative and appears stated age Abdomen: soft, non-tender; bowel sounds normal; no masses,  no organomegaly  Pelvic: External genitalia:  no lesions              Urethra:  normal appearing urethra with no masses, tenderness or lesions              Bartholins and Skenes: normal                 Vagina: normal appearing vagina with normal color and discharge, no lesions              Cervix: no lesions              Bimanual Exam:  Uterus:  normal size, contour, position, consistency, mobility, non-tender              Adnexa: no mass, fullness, tenderness  Endometrial biopsy recommended.  Discussed with patient.  Verbal and written consent  obtained.   Procedure:  Speculum placed.  Cervix visualized and cleansed with betadine prep.  A single toothed tenaculum was applied to the anterior lip of the cervix.  Endometrial pipelle was advanced through the cervix into the endometrial cavity without difficulty.  Pipelle passed to 5cm.  Suction applied and pipelle removed with only mucousy material obtained.  Second pass performed with additional clear, mucousy material obtained in pipelle.  Third pass performed with normal appearing endometrial tissue obtained.  Tenculum removed.  No bleeding noted.  Patient tolerated procedure well.  Chaperone was present for exam.  Assessment: LLQ pain with normal ultrasound except for irregular appearing endometrium Biopsy with signifcant amount of clear mucous like material obtained likely the cause of the endometrial apperance  Plan: Pathology pending.  Results will be called to the pt.  She is aware this may not change LLQ pain but pt reassured by normal appearing ovaries on ultrasound.   ~15 minutes spent with patient >50% of time was in face to face discussion of above.

## 2017-12-19 ENCOUNTER — Encounter: Payer: Self-pay | Admitting: Obstetrics & Gynecology

## 2017-12-22 ENCOUNTER — Telehealth: Payer: Self-pay

## 2017-12-22 NOTE — Telephone Encounter (Signed)
-----   Message from Megan Salon, MD sent at 12/22/2017  9:30 AM EST ----- Please let pt know her endometrial biopsy showed benign tissue.  If pain continues, I would like her to let me know.  Thanks.  CC:  [facility].

## 2017-12-22 NOTE — Telephone Encounter (Signed)
Left message to call [name] at [phone]. 

## 2017-12-27 NOTE — Telephone Encounter (Signed)
Patient returning Kaitlyn's call.  Also requesting a prescription for Klonopin.  States her headache doctor is closing his practice and wants Dr Sabra Heck to take over writing this for her.

## 2017-12-27 NOTE — Telephone Encounter (Signed)
Spoke with patient. Advised of results as seen below patient verbalizes understanding. Patient states that her headache doctor has closed his practice and she only has 10 tablets left of Klonopin. States she was told she could have tremors if she stops cold [place] and is concerned. Asking if Dr.Miller will take over writing this rx for her. Advised this is not an rx we prescribe usually and likely will need to have this filled with PCP or another headache specialist. Advised will review with Dr.Miller and return call.

## 2017-12-28 DIAGNOSIS — R69 Illness, unspecified: Secondary | ICD-10-CM | POA: Diagnosis not present

## 2017-12-28 DIAGNOSIS — G43009 Migraine without aura, not intractable, without status migrainosus: Secondary | ICD-10-CM | POA: Diagnosis not present

## 2017-12-28 DIAGNOSIS — Z5181 Encounter for therapeutic drug level monitoring: Secondary | ICD-10-CM | POA: Diagnosis not present

## 2017-12-29 ENCOUNTER — Other Ambulatory Visit: Payer: Self-pay | Admitting: Obstetrics & Gynecology

## 2017-12-29 DIAGNOSIS — D696 Thrombocytopenia, unspecified: Secondary | ICD-10-CM

## 2017-12-29 MED ORDER — CLONAZEPAM 1 MG PO TABS: 1.0000 mg | ORAL_TABLET | Freq: Every day | ORAL | 1 refills | Status: AC

## 2017-12-29 NOTE — Telephone Encounter (Signed)
Spoke with patient. Patient states that she spoke with her PCP and they are going to take over filling this rx for her. Will continue to see them for evaluation and refills. Encounter closed.

## 2017-12-29 NOTE — Telephone Encounter (Signed)
Please let her know I've done a refill for this until she establishes new care.  Thanks.

## 2018-01-10 DIAGNOSIS — G43709 Chronic migraine without aura, not intractable, without status migrainosus: Secondary | ICD-10-CM | POA: Diagnosis not present

## 2018-01-27 DIAGNOSIS — G4700 Insomnia, unspecified: Secondary | ICD-10-CM | POA: Diagnosis not present

## 2018-01-27 DIAGNOSIS — R69 Illness, unspecified: Secondary | ICD-10-CM | POA: Diagnosis not present

## 2018-02-24 DIAGNOSIS — M810 Age-related osteoporosis without current pathological fracture: Secondary | ICD-10-CM | POA: Diagnosis not present

## 2018-02-24 DIAGNOSIS — G43709 Chronic migraine without aura, not intractable, without status migrainosus: Secondary | ICD-10-CM | POA: Diagnosis not present

## 2018-02-24 DIAGNOSIS — R69 Illness, unspecified: Secondary | ICD-10-CM | POA: Diagnosis not present

## 2018-02-24 DIAGNOSIS — G4700 Insomnia, unspecified: Secondary | ICD-10-CM | POA: Diagnosis not present

## 2018-02-24 DIAGNOSIS — J329 Chronic sinusitis, unspecified: Secondary | ICD-10-CM | POA: Diagnosis not present

## 2018-02-24 DIAGNOSIS — Z0000 Encounter for general adult medical examination without abnormal findings: Secondary | ICD-10-CM | POA: Diagnosis not present

## 2018-04-14 DIAGNOSIS — G43709 Chronic migraine without aura, not intractable, without status migrainosus: Secondary | ICD-10-CM | POA: Diagnosis not present

## 2018-05-27 DIAGNOSIS — R69 Illness, unspecified: Secondary | ICD-10-CM | POA: Diagnosis not present

## 2018-05-27 DIAGNOSIS — G43709 Chronic migraine without aura, not intractable, without status migrainosus: Secondary | ICD-10-CM | POA: Diagnosis not present

## 2018-05-27 DIAGNOSIS — G43009 Migraine without aura, not intractable, without status migrainosus: Secondary | ICD-10-CM | POA: Diagnosis not present

## 2018-07-20 DIAGNOSIS — E538 Deficiency of other specified B group vitamins: Secondary | ICD-10-CM | POA: Diagnosis not present

## 2018-07-20 DIAGNOSIS — R5383 Other fatigue: Secondary | ICD-10-CM | POA: Diagnosis not present

## 2018-07-20 DIAGNOSIS — G43009 Migraine without aura, not intractable, without status migrainosus: Secondary | ICD-10-CM | POA: Diagnosis not present

## 2018-07-20 DIAGNOSIS — R69 Illness, unspecified: Secondary | ICD-10-CM | POA: Diagnosis not present

## 2018-07-20 DIAGNOSIS — F5104 Psychophysiologic insomnia: Secondary | ICD-10-CM | POA: Diagnosis not present

## 2018-07-20 DIAGNOSIS — E559 Vitamin D deficiency, unspecified: Secondary | ICD-10-CM | POA: Diagnosis not present

## 2018-07-28 DIAGNOSIS — G43709 Chronic migraine without aura, not intractable, without status migrainosus: Secondary | ICD-10-CM | POA: Diagnosis not present

## 2018-08-24 DIAGNOSIS — R69 Illness, unspecified: Secondary | ICD-10-CM | POA: Diagnosis not present

## 2018-09-29 DIAGNOSIS — H524 Presbyopia: Secondary | ICD-10-CM | POA: Diagnosis not present

## 2018-10-21 DIAGNOSIS — H6012 Cellulitis of left external ear: Secondary | ICD-10-CM | POA: Diagnosis not present

## 2018-11-07 DIAGNOSIS — G43709 Chronic migraine without aura, not intractable, without status migrainosus: Secondary | ICD-10-CM | POA: Diagnosis not present

## 2018-11-22 DIAGNOSIS — J0100 Acute maxillary sinusitis, unspecified: Secondary | ICD-10-CM | POA: Diagnosis not present

## 2018-11-28 DIAGNOSIS — R69 Illness, unspecified: Secondary | ICD-10-CM | POA: Diagnosis not present

## 2018-11-28 DIAGNOSIS — G43009 Migraine without aura, not intractable, without status migrainosus: Secondary | ICD-10-CM | POA: Diagnosis not present

## 2018-11-28 DIAGNOSIS — G4700 Insomnia, unspecified: Secondary | ICD-10-CM | POA: Diagnosis not present

## 2018-11-28 DIAGNOSIS — J069 Acute upper respiratory infection, unspecified: Secondary | ICD-10-CM | POA: Diagnosis not present

## 2019-01-23 ENCOUNTER — Other Ambulatory Visit: Payer: Self-pay | Admitting: Obstetrics & Gynecology

## 2019-01-23 NOTE — Telephone Encounter (Signed)
Patient is returning a call to Emily. °

## 2019-01-23 NOTE — Telephone Encounter (Signed)
Medication refill request: Fosamax  Last AEX:  11-19-17 SM  Next AEX: 02-02-2019 Last MMG (if hormonal medication request): n/a Refill authorized: please advise.   Spoke with patient. Patient states she will need a refill of fosamax prior to appointment on 02-02-2019 with Dr. Sabra Heck. Medication pended for #2, 0RF as patient has appointment on 02-02-2019.

## 2019-01-23 NOTE — Telephone Encounter (Signed)
Message left to return call to [facility] at [phone].   Need patient to advise if she needs a refill of fosamax prior to appointment with Dr. Sabra Heck on 02-02-2019.

## 2019-01-23 NOTE — Telephone Encounter (Signed)
Message left to return call to Dericka Ostenson at [phone].    

## 2019-01-24 NOTE — Telephone Encounter (Signed)
Pharmacy notified. And RX fixed.

## 2019-01-24 NOTE — Telephone Encounter (Signed)
Can you please call the pharmacy and change the number to #4 instead of 2.  She has appt in February.  Thanks.

## 2019-02-02 ENCOUNTER — Encounter: Payer: Self-pay | Admitting: Obstetrics & Gynecology

## 2019-02-02 ENCOUNTER — Ambulatory Visit (INDEPENDENT_AMBULATORY_CARE_PROVIDER_SITE_OTHER): Payer: Medicare HMO | Admitting: Obstetrics & Gynecology

## 2019-02-02 ENCOUNTER — Other Ambulatory Visit: Payer: Self-pay

## 2019-02-02 ENCOUNTER — Other Ambulatory Visit: Payer: Self-pay | Admitting: Obstetrics & Gynecology

## 2019-02-02 ENCOUNTER — Telehealth: Payer: Self-pay | Admitting: *Deleted

## 2019-02-02 VITALS — BP 94/60 | HR 60 | Resp 16 | Ht 60.75 in | Wt 111.0 lb

## 2019-02-02 DIAGNOSIS — D696 Thrombocytopenia, unspecified: Secondary | ICD-10-CM | POA: Diagnosis not present

## 2019-02-02 DIAGNOSIS — Z01419 Encounter for gynecological examination (general) (routine) without abnormal findings: Secondary | ICD-10-CM | POA: Diagnosis not present

## 2019-02-02 DIAGNOSIS — D4959 Neoplasm of unspecified behavior of other genitourinary organ: Secondary | ICD-10-CM | POA: Diagnosis not present

## 2019-02-02 DIAGNOSIS — R140 Abdominal distension (gaseous): Secondary | ICD-10-CM

## 2019-02-02 DIAGNOSIS — E7800 Pure hypercholesterolemia, unspecified: Secondary | ICD-10-CM | POA: Diagnosis not present

## 2019-02-02 DIAGNOSIS — Z1231 Encounter for screening mammogram for malignant neoplasm of breast: Secondary | ICD-10-CM

## 2019-02-02 DIAGNOSIS — E2839 Other primary ovarian failure: Secondary | ICD-10-CM

## 2019-02-02 MED ORDER — ALENDRONATE SODIUM 70 MG PO TABS: 70.0000 mg | ORAL_TABLET | ORAL | 4 refills | Status: DC

## 2019-02-02 NOTE — Progress Notes (Signed)
68 y.o. G0P0 Married White or Caucasian female here for annual exam.  Continues to have lower pelvic pain.  Ultrasound done last year showed cystic spaces within endometrium.  Biopsy was negative.  We discussed last year proceeding with hysteroscopy last year if pain continued.  She is having increased issues with bloating at well.  She is due having a colonoscopy done this year.  Has been having work done in her kitchen.  This has finally finished.  She can't find her letter for reminder.    Denies vaginal bleeding.  Continues to have issues with migraines.  Followed by Dr. Olevia Bowens.  Is hoping to start a monthly injection for migraine prevention.  She is receiving Botox injections.    PCP:  Mat Carne, PA.  Patient's last menstrual period was 12/14/1985.          Sexually active: Yes.    The current method of family planning is post menopausal status.    Exercising: Yes.    weights, treadmill, walking Smoker:  no  Health Maintenance: Pap:  11/19/17 Neg   05/07/15 Neg  History of abnormal Pap:  no MMG:  11/02/16 BIRADS1:Neg  Aware this is due.   BMD:  11/17 -2.6 TDaP:  2018 Pneumonia vaccine(s):  2017 Shingrix:   Declines today Hep C testing: done  Screening Labs: will discuss    reports that she quit smoking about 32 years ago. Her smoking use included cigarettes. She started smoking about 39 years ago. She has a 3.50 pack-year smoking history. She has never used smokeless tobacco. She reports that she does not drink alcohol or use drugs.  Past Medical History:  Diagnosis Date  . Anxiety   . Arthritis   . Bursitis   . Depression   . Headache(784.0)   . Osteoporosis   . Thrombocytopenia ([place]) 11/26/2011    Past Surgical History:  Procedure Laterality Date  . ANAL FISSURE REPAIR    . BUNIONECTOMY     both feet  . COLONOSCOPY    . HAND SURGERY Right 04-17-15   thumb surgery  . HARDWARE REMOVAL  04/14/2012   Procedure: HARDWARE REMOVAL;  Surgeon: Linna Hoff, MD;   Location: [facility];  Service: Orthopedics;  Laterality: Left;  left wrist deep hardware excision  . WRIST ARTHROPLASTY  2011   left     Current Outpatient Medications  Medication Sig Dispense Refill  . alendronate (FOSAMAX) 70 MG tablet TAKE 1 TABLET BY MOUTH ONCE A WEEK. TAKE WITH A FULL GLASS OF WATER ON AN EMPTY STOMACH. 2 tablet 0  . BIOTIN PO Take by mouth.    Marland Kitchen BOTOX 200 units SOLR     . Cholecalciferol (VITAMIN D3) 1000 UNITS CAPS Take by mouth daily.    . citalopram (CELEXA) 20 MG tablet Take by mouth.    . clonazePAM (KLONOPIN) 1 MG tablet Take 1 tablet (1 mg total) by mouth daily. 30 tablet 1  . cyanocobalamin ([facility] VITAMIN B12) 1000 MCG tablet Take by mouth.    Marland Kitchen ipratropium (ATROVENT) 0.06 % nasal spray USE 2 SPRAYS INTRANASALLY FOUR TIMES DAILY    . Magnesium 500 MG TABS Take by mouth daily.    . Melatonin 10 MG TABS Take by mouth at bedtime.    . promethazine (PHENERGAN) 12.5 MG tablet Take 12.5 mg by mouth every 6 (six) hours as needed.      . pyridoxine (B-6) 100 MG tablet Take by mouth.    . SUMAtriptan (IMITREX) 100  MG tablet TAKE 1 TABLET BY MOUTH FOR MIGRAINE.MAY REPEAT IN 2 HRS. NO MORE THAN 2TABS IN 24HRS OR 2-3DAYS/WK  1   No current facility-administered medications for this visit.     Family History  Problem Relation Age of Onset  . Diabetes Mother   . Hypertension Mother   . Stroke Mother   . Dementia Mother   . Heart disease Father   . Cancer Father        lung cancer    Review of Systems  Gastrointestinal: Positive for abdominal distention, abdominal pain and constipation.  Endocrine: Positive for cold intolerance, heat intolerance and polydipsia.  Neurological: Positive for headaches.  All other systems reviewed and are negative.   Exam:   BP 94/60 (BP Location: Right Arm, Patient Position: Sitting, Cuff Size: Normal)   Pulse 60   Resp 16   Ht 5' 0.75" (1.543 m)   Wt 111 lb (50.3 kg)   LMP 12/14/1985   BMI 21.15 kg/m     Height: 5' 0.75" (154.3 cm)  Ht Readings from Last 3 Encounters:  02/02/19 5' 0.75" (1.543 m)  12/16/17 5' 0.25" (1.53 m)  11/19/17 5' 0.25" (1.53 m)    General appearance: alert, cooperative and appears stated age Head: Normocephalic, without obvious abnormality, atraumatic Neck: no adenopathy, supple, symmetrical, trachea midline and thyroid normal to inspection and palpation Lungs: clear to auscultation bilaterally Breasts: normal appearance, no masses or tenderness Heart: regular rate and rhythm Abdomen: soft, non-tender; bowel sounds normal; no masses,  no organomegaly Extremities: extremities normal, atraumatic, no cyanosis or edema Skin: Skin color, texture, turgor normal. No rashes or lesions Lymph nodes: Cervical, supraclavicular, and axillary nodes normal. No abnormal inguinal nodes palpated Neurologic: Grossly normal   Pelvic: External genitalia:  no lesions              Urethra:  normal appearing urethra with no masses, tenderness or lesions              Bartholins and Skenes: normal                 Vagina: normal appearing vagina with normal color and discharge, no lesions              Cervix: no lesions              Pap taken: No. Bimanual Exam:  Uterus:  normal size, contour, position, consistency, mobility, non-tender              Adnexa: normal adnexa and no mass, fullness, tenderness               Rectovaginal: Confirms               Anus:  normal sphincter tone, no lesions  Chaperone was present for exam.  A:  Well Woman with normal exam PMP, no HRT H/O throbocytopenia and leukopenia.  Has been seen by Dr. Marin Olp who released her in 2015 Migraines, followed by neurology LLQ, bloating Osteopenia  P:   Mammogram will be scheduled for pt with BMD pap smear not indicated Return for PUS Colonoscopy due.  She will call if cannot find reminder letter for referral CBC and Lipids obtained today Rx for fosamax 70mg  orally, weekly, on empty stomach. Return  annually or prn

## 2019-02-02 NOTE — Addendum Note (Signed)
Addended by: Polly Cobia on: 02/02/2019 11:19 AM   Modules accepted: Orders

## 2019-02-02 NOTE — Telephone Encounter (Signed)
Called patient. Requested Voicemail with appt info. Message left on voicemail:  MMG and BMD appt scheduled at [facility] for Tuesday March 31st arrive by 8:40am. MMG appt 9:00am, BMD appt 9:30am.   Encounter closed.

## 2019-02-03 LAB — LIPID PANEL
Chol/HDL Ratio: 2.5 ratio (ref 0.0–4.4)
Cholesterol, Total: 211 mg/dL — ABNORMAL HIGH (ref 100–199)
HDL: 85 mg/dL (ref >39)
LDL Calculated: 115 mg/dL — ABNORMAL HIGH (ref 0–99)
Triglycerides: 55 mg/dL (ref 0–149)
VLDL Cholesterol Cal: 11 mg/dL (ref 5–40)

## 2019-02-03 LAB — CBC WITH DIFFERENTIAL/PLATELET
Basophils Absolute: 0 10*3/uL (ref 0.0–0.2)
Basos: 1 %
EOS (ABSOLUTE): 0 10*3/uL (ref 0.0–0.4)
Eos: 1 %
Hematocrit: 39 % (ref 34.0–46.6)
Hemoglobin: 13.5 g/dL (ref 11.1–15.9)
IMMATURE GRANS (ABS): 0 10*3/uL (ref 0.0–0.1)
Immature Granulocytes: 0 %
Lymphocytes Absolute: 1.3 10*3/uL (ref 0.7–3.1)
Lymphs: 46 %
MCH: 32.3 pg (ref 26.6–33.0)
MCHC: 34.6 g/dL (ref 31.5–35.7)
MCV: 93 fL (ref 79–97)
Monocytes Absolute: 0.2 10*3/uL (ref 0.1–0.9)
Monocytes: 8 %
Neutrophils Absolute: 1.3 10*3/uL — ABNORMAL LOW (ref 1.4–7.0)
Neutrophils: 44 %
Platelets: 107 10*3/uL — ABNORMAL LOW (ref 150–450)
RBC: 4.18 x10E6/uL (ref 3.77–5.28)
RDW: 12.3 % (ref 11.7–15.4)
WBC: 2.9 10*3/uL — ABNORMAL LOW (ref 3.4–10.8)

## 2019-02-09 ENCOUNTER — Other Ambulatory Visit: Payer: Self-pay

## 2019-02-09 ENCOUNTER — Ambulatory Visit (INDEPENDENT_AMBULATORY_CARE_PROVIDER_SITE_OTHER): Payer: Medicare HMO | Admitting: Obstetrics & Gynecology

## 2019-02-09 ENCOUNTER — Ambulatory Visit (INDEPENDENT_AMBULATORY_CARE_PROVIDER_SITE_OTHER): Payer: Medicare HMO

## 2019-02-09 VITALS — BP 100/62 | HR 64 | Resp 16 | Ht 60.75 in | Wt 111.0 lb

## 2019-02-09 DIAGNOSIS — D4959 Neoplasm of unspecified behavior of other genitourinary organ: Secondary | ICD-10-CM

## 2019-02-09 DIAGNOSIS — G43709 Chronic migraine without aura, not intractable, without status migrainosus: Secondary | ICD-10-CM | POA: Diagnosis not present

## 2019-02-09 DIAGNOSIS — Z1211 Encounter for screening for malignant neoplasm of colon: Secondary | ICD-10-CM | POA: Diagnosis not present

## 2019-02-09 DIAGNOSIS — R140 Abdominal distension (gaseous): Secondary | ICD-10-CM

## 2019-02-09 DIAGNOSIS — N840 Polyp of corpus uteri: Secondary | ICD-10-CM

## 2019-02-09 NOTE — Progress Notes (Signed)
68 y.o. G0P0 Married White or Caucasian female here for pelvic ultrasound due to LLQ pain for additional evaluation.  Pt does also need to see GI for colonoscopy.  She has decided to be seen in [place] so needs referral.  Options reviewed.  Referral will be placed.  Patient's last menstrual period was 12/14/1985.  Contraception: PMP  Findings:  UTERUS: 5.2 x 3.1 x 1.7cm EMS: 2.76mm, echogenic with solid/cystic spaces that appears to be multiple endometrial polyps ADNEXA: Left ovary: 1.4 x 0.7 x 0.7cm       Right ovary: 1.2 x 1.0 x 0.7cm CUL DE SAC: no free fluid  Discussion:  Findings reviewed.  Although I am not convinced this is the cause of her pain, he endometrium is abnormal and there do appear to be multiple polyps.  This is abnormal and additional evaluation would be warranted.  Hysteroscopy with polyp resection, D&C recommended.  Procedure discussed with patient.  Recovery and pain management discussed.  Risks discussed including but not limited to bleeding, rare risk of transfusion, infection, 1% risk of uterine perforation with risks of fluid deficit causing cardiac arrythmia, cerebral swelling and/or need to stop procedure early.  Fluid emboli and rare risk of death discussed.  DVT/PE, rare risk of risk of bowel/bladder/ureteral/vascular injury.  Patient aware if pathology abnormal she may need additional treatment.  All questions answered.    Assessment:  Abnormal appearing endometrium LLQ pain that has been present for about a year  Plan:  Hysteroscopy with polyp resection, D&C recommended.  Pt in agreement with plan.   Referral for colonoscopy placed.    ~25 minutes spent with patient >50% of time was in face to face discussion of above.

## 2019-02-10 ENCOUNTER — Encounter: Payer: Self-pay | Admitting: Obstetrics & Gynecology

## 2019-02-10 ENCOUNTER — Telehealth: Payer: Self-pay | Admitting: *Deleted

## 2019-02-10 NOTE — Telephone Encounter (Signed)
Call to patient. Left message to call back for assistance with scheduling. Calling to schedule surgery procedure.

## 2019-02-10 NOTE — Telephone Encounter (Signed)
Call to patient. Reviewed surgery date options and scheduling policy. Patient agreeable and will review date options with family and call back.

## 2019-02-13 NOTE — Telephone Encounter (Signed)
Patient calling to discuss surgery dates

## 2019-02-14 NOTE — Telephone Encounter (Signed)
Return call to patient. Discussed surgery date. Patient desires to proceed on 03-06-2019.

## 2019-02-14 NOTE — Telephone Encounter (Signed)
Patient returning call to discuss surgery. States the date discussed works for her, she just needs to know the time.

## 2019-02-15 NOTE — Telephone Encounter (Signed)
Call to patient to review Surgery Information Form and schedule post-op appointments. Patient states she is driving and would like to return call.

## 2019-02-15 NOTE — Telephone Encounter (Signed)
Call to patient. Surgery Information Form reviewed in detail with patient and she verbalized understanding. Aware will be contacted by [facility] for pre op at their facility. Post op appointment scheduled for Thursday 03-16-2019 at 1445. Patient agreeable to date and time of appointment. Address on file confirmed by patient. Patient aware will be mailed a copy of Surgery Information Form. Patient wanting to ask Dr. Sabra Heck if she thinks she will be able to go bowling on the Wednesday (03-08-2019) following her surgery, or if she should plan to pre-bowl. RN advised would review with Dr. Sabra Heck and return call. Patient agreeable.   Routing to provider for review.

## 2019-02-15 NOTE — Telephone Encounter (Signed)
Patient left voicemail over lunch returning call to [place], [place].

## 2019-02-17 ENCOUNTER — Other Ambulatory Visit: Payer: Self-pay | Admitting: Obstetrics & Gynecology

## 2019-03-01 ENCOUNTER — Telehealth: Payer: Self-pay | Admitting: *Deleted

## 2019-03-01 NOTE — Telephone Encounter (Signed)
Patient has surgery on Monday and wondering if she can bowl on Wednesday.

## 2019-03-01 NOTE — Telephone Encounter (Addendum)
Left detailed message. Okay per designated party release form.  Advised okay to bowl on Wednesday (reviewed with Lamont Snowball, RN) but will need to monitor symptoms and bowl only if she feels okay and would need to stop if had any concerns.    Pt returned call and now said bowling alley is closed so does not need any further advice.  Encounter closed.

## 2019-03-02 ENCOUNTER — Encounter (HOSPITAL_BASED_OUTPATIENT_CLINIC_OR_DEPARTMENT_OTHER): Payer: Self-pay

## 2019-03-02 ENCOUNTER — Encounter (HOSPITAL_COMMUNITY)
Admission: RE | Admit: 2019-03-02 | Discharge: 2019-03-02 | Disposition: A | Discharge status: Home / Self Care | Payer: Medicare HMO | Source: Ambulatory Visit | Attending: Obstetrics & Gynecology | Admitting: Obstetrics & Gynecology

## 2019-03-02 ENCOUNTER — Telehealth: Payer: Self-pay | Admitting: Obstetrics & Gynecology

## 2019-03-02 ENCOUNTER — Other Ambulatory Visit: Payer: Self-pay

## 2019-03-02 DIAGNOSIS — Z01812 Encounter for preprocedural laboratory examination: Secondary | ICD-10-CM | POA: Diagnosis not present

## 2019-03-02 LAB — BASIC METABOLIC PANEL
Anion gap: 6 (ref 5–15)
BUN: 14 mg/dL (ref 8–23)
CO2: 29 mmol/L (ref 22–32)
Calcium: 9.1 mg/dL (ref 8.9–10.3)
Chloride: 101 mmol/L (ref 98–111)
Creatinine, Ser: 0.81 mg/dL (ref 0.44–1.00)
GFR calc Af Amer: >60 mL/min (ref >60)
GFR calc non Af Amer: >60 mL/min (ref >60)
Glucose, Bld: 86 mg/dL (ref 70–99)
POTASSIUM: 4.1 mmol/L (ref 3.5–5.1)
Sodium: 136 mmol/L (ref 135–145)

## 2019-03-02 LAB — CBC
HCT: 41.3 % (ref 36.0–46.0)
HEMOGLOBIN: 13.4 g/dL (ref 12.0–15.0)
MCH: 31.7 pg (ref 26.0–34.0)
MCHC: 32.4 g/dL (ref 30.0–36.0)
MCV: 97.6 fL (ref 80.0–100.0)
Platelets: 108 10*3/uL — ABNORMAL LOW (ref 150–400)
RBC: 4.23 MIL/uL (ref 3.87–5.11)
RDW: 12.1 % (ref 11.5–15.5)
WBC: 3.2 10*3/uL — ABNORMAL LOW (ref 4.0–10.5)
nRBC: 0 % (ref 0.0–0.2)

## 2019-03-02 NOTE — Telephone Encounter (Signed)
Call placed to patient. Advised patient 03/06/2019 surgery will need to be cancelled due to hospital guidelines. Scheduled a consultation with Dr Sabra Heck on 03/03/2019 to discuss options. Patient is aware of possible endometrial biopsy.   Forwarding to Lamont Snowball, RN

## 2019-03-02 NOTE — Progress Notes (Signed)
Spoke with:  Rosann Auerbach NPO:  After Midnight, no gum, candy, or mints   Arrival time: 0530AM Labs:  (CBC, BMP 03/02/2019 in epic) AM medications: Celexa and Nasal spray if needed Pre op orders:  Yes Ride home:  Delsa Bern (husband) [phone]

## 2019-03-02 NOTE — Telephone Encounter (Signed)
Surgery and all related appointments canceled. Routing to Dr Sabra Heck. Encounter closed.

## 2019-03-03 ENCOUNTER — Ambulatory Visit: Payer: Medicare HMO | Admitting: Obstetrics & Gynecology

## 2019-03-03 ENCOUNTER — Encounter: Payer: Self-pay | Admitting: Obstetrics & Gynecology

## 2019-03-03 ENCOUNTER — Ambulatory Visit (INDEPENDENT_AMBULATORY_CARE_PROVIDER_SITE_OTHER): Payer: Medicare HMO | Admitting: Obstetrics & Gynecology

## 2019-03-03 ENCOUNTER — Other Ambulatory Visit: Payer: Self-pay

## 2019-03-03 VITALS — BP 108/62 | HR 68 | Resp 16 | Ht 60.75 in | Wt 109.2 lb

## 2019-03-03 DIAGNOSIS — R9389 Abnormal findings on diagnostic imaging of other specified body structures: Secondary | ICD-10-CM

## 2019-03-03 NOTE — Progress Notes (Signed)
GYNECOLOGY  VISIT  CC:   Consult   HPI: 68 y.o. G0P0 Married White or Caucasian female here for endometrial biopsy.  Pt was scheduled for hysteroscopy and polyp resection with D&C but due to Covid 19 this has been postponed due to health system guidelines.  As I am unsure how long it will before this is rescheduled, I recommended the come in for an endometrial biopsy.  She has no questions or concerns and is very nice and understanding.  GYNECOLOGIC HISTORY: Patient's last menstrual period was 12/14/1985. Contraception: PMP Menopausal hormone therapy: none  Patient Active Problem List   Diagnosis Date Noted  . Migraine 11/19/2017  . Osteoporosis 01/03/2017  . Thrombocytopenia ([place]) 11/26/2011    Past Medical History:  Diagnosis Date  . Anxiety   . Arthritis   . Bursitis   . Constipation   . DDD (degenerative disc disease), lumbar 2011   Moderate  . 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 Endometrial polyp   . Environmental and seasonal allergies   . Hepatic cyst 2011   Multiple hepatic cysts, largest measuring 5 cm  . Left wrist fracture 2011  . Lumbar spondylosis   . Migraines   . Osteoporosis   . Thrombocytopenia ([place]) 11/26/2011  . Wears glasses     Past Surgical History:  Procedure Laterality Date  . ANAL FISSURE REPAIR    . BLEPHAROPLASTY Bilateral   . BUNIONECTOMY     both feet  . COLONOSCOPY  2011  . HARDWARE REMOVAL  04/14/2012   Procedure: HARDWARE REMOVAL;  Surgeon: Linna Hoff, MD;  Location: [place];  Service: Orthopedics;  Laterality: Left;  left wrist deep hardware excision  . THUMB SURGERY Right 04-17-15   thumb surgery  . WRIST ARTHROPLASTY  2011   left     MEDS:   Current Outpatient Medications on File Prior to Visit  Medication Sig Dispense Refill  . alendronate (FOSAMAX) 70 MG tablet Take 1 tablet (70 mg total) by mouth once a week. Take with a full glass of water on an  empty stomach. 12 tablet 4  . BIOTIN PO Take by mouth.    Marland Kitchen BOTOX 200 units SOLR every 3 (three) months.     . Cholecalciferol (VITAMIN D3) 1000 UNITS CAPS Take by mouth daily.    . citalopram (CELEXA) 20 MG tablet Take by mouth every morning.     . clonazePAM (KLONOPIN) 1 MG tablet Take 1 tablet (1 mg total) by mouth daily. (Patient taking differently: Take 1 mg by mouth every evening. ) 30 tablet 1  . cyanocobalamin ([employer]S VITAMIN B12) 1000 MCG tablet Take by mouth.    Marland Kitchen ipratropium (ATROVENT) 0.06 % nasal spray as needed.     . Magnesium 500 MG TABS Take by mouth daily.    . Melatonin 10 MG TABS Take by mouth at bedtime.    . Probiotic Product (PROBIOTIC DAILY PO) Take by mouth.    . promethazine (PHENERGAN) 12.5 MG tablet Take 12.5 mg by mouth every 6 (six) hours as needed.      . pyridoxine (B-6) 100 MG tablet Take by mouth.    . SUMAtriptan (IMITREX) 100 MG tablet TAKE 1 TABLET BY MOUTH FOR MIGRAINE.MAY REPEAT IN 2 HRS. NO MORE THAN 2TABS IN 24HRS OR 2-3DAYS/WK  1   No current facility-administered medications on file prior to visit.     ALLERGIES: Corticosteroids; Levonorgestrel-ethinyl estrad;  Cortisone; Other; and Latex  Family History  Problem Relation Age of Onset  . Diabetes Mother   . Hypertension Mother   . Stroke Mother   . Dementia Mother   . Heart disease Father   . Cancer Father        lung cancer    SH:  Married, non smoker  Review of Systems  Gastrointestinal: Positive for constipation.  Endocrine: Positive for cold intolerance and heat intolerance.  All other systems reviewed and are negative.   PHYSICAL EXAMINATION:    LMP 12/14/1985     General appearance: alert, cooperative and appears stated age Lymph:  no inguinal LAD noted  Pelvic: External genitalia:  no lesions              Urethra:  normal appearing urethra with no masses, tenderness or lesions              Bartholins and Skenes: normal                 Vagina: normal appearing vagina with  normal color and discharge, no lesions              Cervix: no lesions              Bimanual Exam:  Uterus:  normal size, contour, position, consistency, mobility, non-tender              Adnexa: no mass, fullness, tenderness           Endometrial biopsy recommended.  Discussed with patient.  Verbal and written consent obtained.   Procedure:  Speculum placed.  Cervix visualized and cleansed with betadine prep.  A single toothed tenaculum was applied to the anterior lip of the cervix.  Endometrial pipelle was advanced through the cervix into the endometrial cavity without difficulty.  Pipelle passed to 6.5cm.  Suction applied and pipelle removed with scant tissue sample obtained.  This was performed two more times.  I am certain that the pipelle is in the correct location and after three attempt and scant tissue, procedure ended.  Tenculum removed.  No bleeding noted.  Patient tolerated procedure well.  Chaperone was present for exam.  Assessment: Endometrial thickness with probable endometrial polyp  Plan: Endometrial biopsy pending.  Results will be called to pt.

## 2019-03-06 ENCOUNTER — Ambulatory Visit (HOSPITAL_BASED_OUTPATIENT_CLINIC_OR_DEPARTMENT_OTHER): Admission: RE | Admit: 2019-03-06 | Payer: Medicare HMO | Source: Home / Self Care | Admitting: Obstetrics & Gynecology

## 2019-03-06 HISTORY — DX: Fracture of unspecified carpal bone, left wrist, initial encounter for closed fracture: S62.102A

## 2019-03-06 HISTORY — DX: Polyp of corpus uteri: N84.0

## 2019-03-06 HISTORY — DX: Migraine, unspecified, not intractable, without status migrainosus: G43.909

## 2019-03-06 HISTORY — DX: Presence of spectacles and contact lenses: Z97.3

## 2019-03-06 HISTORY — DX: Other specified diseases of liver: K76.89

## 2019-03-06 HISTORY — DX: Acute embolism and thrombosis of unspecified deep veins of unspecified lower extremity: I82.409

## 2019-03-06 HISTORY — DX: Spondylosis without myelopathy or radiculopathy, lumbar region: M47.816

## 2019-03-06 HISTORY — DX: Other intervertebral disc degeneration, lumbar region: M51.36

## 2019-03-06 HISTORY — DX: Other allergic rhinitis: J30.89

## 2019-03-06 HISTORY — DX: Constipation, unspecified: K59.00

## 2019-03-06 SURGERY — DILATATION & CURETTAGE/HYSTEROSCOPY WITH MYOSURE
Anesthesia: Choice

## 2019-03-08 ENCOUNTER — Telehealth: Payer: Self-pay

## 2019-03-08 NOTE — Telephone Encounter (Signed)
Left message to call Kaitlyn at [phone]. 

## 2019-03-08 NOTE — Telephone Encounter (Signed)
-----   Message from Megan Salon, MD sent at 03/08/2019  6:21 AM EDT ----- Please let pt know that biopsy showed benign endometrial tissue.  We will plan the hysteroscopy and polyp resection with OR recommendations change (currently no elective cases due to Covid 19).

## 2019-03-09 NOTE — Telephone Encounter (Signed)
Call to patient. Advised of endometrial biopsy result and recommendation as directed by Dr Sabra Heck.  Advised will call her as soon as able to proceed with scheduling surgery.  Patient is advised to call back if any change in status before surgery.   Encounter closed.

## 2019-03-14 ENCOUNTER — Other Ambulatory Visit: Payer: Medicare Other

## 2019-03-14 ENCOUNTER — Ambulatory Visit: Payer: Medicare Other

## 2019-03-16 ENCOUNTER — Ambulatory Visit: Payer: Medicare HMO | Admitting: Obstetrics & Gynecology

## 2019-04-25 ENCOUNTER — Telehealth: Payer: Self-pay | Admitting: *Deleted

## 2019-04-25 ENCOUNTER — Other Ambulatory Visit: Payer: Self-pay | Admitting: Obstetrics & Gynecology

## 2019-04-25 NOTE — Telephone Encounter (Signed)
Call to patient. Advised hospital now able to reschedule previously canceled surgery ( canceled due to Covid 19). Discussed date option of 05-05-19 and patient agreeable. Reviewed surgery instruction sheet and will provide updated copy at consult appointment on 05-01-19.  Routing to Dr Sabra Heck. Encounter closed.

## 2019-04-27 ENCOUNTER — Other Ambulatory Visit: Payer: Self-pay

## 2019-04-27 ENCOUNTER — Encounter (HOSPITAL_BASED_OUTPATIENT_CLINIC_OR_DEPARTMENT_OTHER): Payer: Self-pay | Admitting: *Deleted

## 2019-04-27 NOTE — Progress Notes (Signed)
SPOKE W/  _ pt via phone     SCREENING SYMPTOMS OF COVID 19:   COUGH--  no  RUNNY NOSE--- allergies  SORE THROAT--- no  NASAL CONGESTION---- allergies  SNEEZING---- allergies  SHORTNESS OF BREATH--- no  DIFFICULTY BREATHING--- no  TEMP >100.0 ----- no  UNEXPLAINED BODY ACHES------ no  CHILLS -------- no  HEADACHES --------- no  LOSS OF SMELL/ TASTE -------- no    HAVE YOU OR ANY FAMILY MEMBER TRAVELLED PAST 14 DAYS OUT OF THE   COUNTY--- no STATE---- no COUNTRY---- no  HAVE YOU OR ANY FAMILY MEMBER BEEN EXPOSED TO ANYONE WITH COVID 19?   Denies.

## 2019-04-27 NOTE — Progress Notes (Signed)
Spoke w/ pt via phone for pre-op interview.  Npo after mn.  Arrive at [employer]  Will take celexa am dos w/ sips of water.  Getting cbc, bmp, and covid done Tuesday 05-02-2019 @ 1400.

## 2019-04-28 ENCOUNTER — Other Ambulatory Visit: Payer: Self-pay

## 2019-05-01 ENCOUNTER — Other Ambulatory Visit: Payer: Self-pay

## 2019-05-01 ENCOUNTER — Encounter: Payer: Self-pay | Admitting: Obstetrics & Gynecology

## 2019-05-01 ENCOUNTER — Ambulatory Visit (INDEPENDENT_AMBULATORY_CARE_PROVIDER_SITE_OTHER): Payer: Medicare HMO | Admitting: Obstetrics & Gynecology

## 2019-05-01 VITALS — BP 100/60 | HR 60 | Temp 97.3°F | Ht 60.75 in | Wt 110.0 lb

## 2019-05-01 DIAGNOSIS — R9389 Abnormal findings on diagnostic imaging of other specified body structures: Secondary | ICD-10-CM

## 2019-05-01 DIAGNOSIS — N840 Polyp of corpus uteri: Secondary | ICD-10-CM

## 2019-05-01 NOTE — Progress Notes (Signed)
68 y.o. G0P0 Married White or Caucasian female here for discussion of upcoming procedure Dilatation and Curettage/ Hysteroscopy with Myosure on 05/05/19 planned due to endometrial polyp that is present within the endometrium.  She did have an endometrial biopsy in march that was negative.    Procedure discussed with patient.  Recovery and pain management discussed.  Risks discussed including but not limited to bleeding, rare risk of transfusion, infection, 1% risk of uterine perforation with risks of fluid deficit causing cardiac arrythmia, cerebral swelling and/or need to stop procedure early.  Fluid emboli and rare risk of death discussed.  DVT/PE, rare risk of risk of bowel/bladder/ureteral/vascular injury.  Patient aware if pathology abnormal she may need additional treatment.  All questions answered.    Ob Hx:   Patient's last menstrual period was 12/14/1985.          Sexually active: Yes.   Birth control: no method Last pap: 11/19/17 Neg  Last MMG: 11/02/16 BIRADS1:neg. Has appt 05/19/19  Tobacco: No  Past Surgical History:  Procedure Laterality Date  . ANAL FISSURE REPAIR    . BLEPHAROPLASTY Bilateral   . BUNIONECTOMY Bilateral   . COLONOSCOPY  last one 2011  . HARDWARE REMOVAL  04/14/2012   Procedure: HARDWARE REMOVAL;  Surgeon: Linna Hoff, MD;  Location: [place];  Service: Orthopedics;  Laterality: Left;  left wrist deep hardware excision  . ORIF WRIST FRACTURE Left 2011  . THUMB SURGERY Right 04/17/2015    Past Medical History:  Diagnosis Date  . Anxiety   . Arthritis   . Bursitis   . Chronic constipation    takes magnesium and probiotic  . Chronic leukopenia    previouly followed by dr Marin Olp  . Chronic migraine    neurology-- barbara beck PA ([facility])  . DDD (degenerative disc disease), lumbar 2011   Moderate  . Endometrial thickening on ultrasound   . Environmental and seasonal allergies   . Hepatic cyst 2011   Multiple hepatic  cysts, largest measuring 5 cm  . 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 Lumbar spondylosis   . Migraines   . Osteoporosis   . PMB (postmenopausal bleeding)   . Thrombocytopenia ([place])    chronic  (previously followed by dr Marin Olp, released 2015)  . Wears glasses     Allergies: Corticosteroids; Levonorgestrel-ethinyl estrad; Cortisone; Other; and Latex  Current Outpatient Medications  Medication Sig Dispense Refill  . alendronate (FOSAMAX) 70 MG tablet Take 1 tablet (70 mg total) by mouth once a week. Take with a full glass of water on an empty stomach. 12 tablet 4  . BIOTIN PO Take by mouth.    . Cholecalciferol (VITAMIN D3) 1000 UNITS CAPS Take by mouth daily.    . citalopram (CELEXA) 20 MG tablet Take by mouth every morning.     . clonazePAM (KLONOPIN) 1 MG tablet Take 1 tablet (1 mg total) by mouth daily. (Patient taking differently: Take 1 mg by mouth every evening. ) 30 tablet 1  . cyanocobalamin ([employer] VITAMIN B12) 1000 MCG tablet Take by mouth.    . Fremanezumab-vfrm (AJOVY) 225 MG/1.5ML SOSY Inject into the skin.    Marland Kitchen ipratropium (ATROVENT) 0.06 % nasal spray as needed.     . Magnesium 500 MG TABS Take by mouth daily.    . Melatonin 10 MG TABS Take by mouth at bedtime.    . Probiotic Product (PROBIOTIC DAILY PO) Take by mouth.    Marland Kitchen  promethazine (PHENERGAN) 12.5 MG tablet Take 12.5 mg by mouth every 6 (six) hours as needed.      . pyridoxine (B-6) 100 MG tablet Take by mouth.    . SUMAtriptan (IMITREX) 100 MG tablet TAKE 1 TABLET BY MOUTH FOR MIGRAINE.MAY REPEAT IN 2 HRS. NO MORE THAN 2TABS IN 24HRS OR 2-3DAYS/WK  1   No current facility-administered medications for this visit.     ROS: Pertinent items noted in HPI and remainder of comprehensive ROS otherwise negative.  Exam:    BP 100/60   Pulse 60   Temp (!) 97.3 F (36.3 C) (Temporal)   Ht 5' 0.75" (1.543 m)   Wt 110 lb (49.9 kg)   LMP 12/14/1985   BMI 20.96 kg/m   General appearance: alert  and cooperative Head: Normocephalic, without obvious abnormality, atraumatic Neck: no adenopathy, supple, symmetrical, trachea midline and thyroid not enlarged, symmetric, no tenderness/mass/nodules Lungs: clear to auscultation bilaterally Heart: regular rate and rhythm, S1, S2 normal, no murmur, click, rub or gallop Abdomen: soft, non-tender; bowel sounds normal; no masses,  no organomegaly Extremities: extremities normal, atraumatic, no cyanosis or edema Skin: Skin color, texture, turgor normal. No rashes or lesions Lymph nodes: Cervical, supraclavicular, and axillary nodes normal. no inguinal nodes palpated Neurologic: Grossly normal  Pelvic: External genitalia:  no lesions              Urethra: normal appearing urethra with no masses, tenderness or lesions              Bartholins and Skenes: Bartholin's, Urethra, Skene's normal                 Vagina: normal appearing vagina with normal color and discharge, no lesions, atrophic              Cervix: normal appearance              Pap taken: No.        Bimanual Exam:  Uterus:  uterus is normal size, shape, consistency and nontender                                      Adnexa:    normal adnexa in size, nontender and no masses                                      Rectovaginal: Deferred  A: Endometrial polyp  Thickened endometrium     P:  Hysteroscopy with polyp resection, dilation and curettage planned Questions answered.

## 2019-05-02 ENCOUNTER — Other Ambulatory Visit (HOSPITAL_COMMUNITY)
Admission: RE | Admit: 2019-05-02 | Discharge: 2019-05-02 | Disposition: A | Discharge status: Home / Self Care | Payer: Medicare HMO | Source: Ambulatory Visit | Attending: Obstetrics & Gynecology | Admitting: Obstetrics & Gynecology

## 2019-05-02 ENCOUNTER — Encounter (HOSPITAL_COMMUNITY)
Admission: RE | Admit: 2019-05-02 | Discharge: 2019-05-02 | Disposition: A | Discharge status: Home / Self Care | Payer: Medicare HMO | Source: Ambulatory Visit | Attending: Optometry | Admitting: Optometry

## 2019-05-02 DIAGNOSIS — Z823 Family history of stroke: Secondary | ICD-10-CM | POA: Diagnosis not present

## 2019-05-02 DIAGNOSIS — Z801 Family history of malignant neoplasm of trachea, bronchus and lung: Secondary | ICD-10-CM | POA: Diagnosis not present

## 2019-05-02 DIAGNOSIS — D72819 Decreased white blood cell count, unspecified: Secondary | ICD-10-CM | POA: Diagnosis not present

## 2019-05-02 DIAGNOSIS — Z833 Family history of diabetes mellitus: Secondary | ICD-10-CM | POA: Diagnosis not present

## 2019-05-02 DIAGNOSIS — R9389 Abnormal findings on diagnostic imaging of other specified body structures: Secondary | ICD-10-CM | POA: Diagnosis not present

## 2019-05-02 DIAGNOSIS — R69 Illness, unspecified: Secondary | ICD-10-CM | POA: Diagnosis not present

## 2019-05-02 DIAGNOSIS — Z79899 Other long term (current) drug therapy: Secondary | ICD-10-CM | POA: Diagnosis not present

## 2019-05-02 DIAGNOSIS — Z87891 Personal history of nicotine dependence: Secondary | ICD-10-CM | POA: Diagnosis not present

## 2019-05-02 DIAGNOSIS — M5136 Other intervertebral disc degeneration, lumbar region: Secondary | ICD-10-CM | POA: Diagnosis not present

## 2019-05-02 DIAGNOSIS — Z91040 Latex allergy status: Secondary | ICD-10-CM | POA: Diagnosis not present

## 2019-05-02 DIAGNOSIS — K5909 Other constipation: Secondary | ICD-10-CM | POA: Diagnosis not present

## 2019-05-02 DIAGNOSIS — M719 Bursopathy, unspecified: Secondary | ICD-10-CM | POA: Diagnosis not present

## 2019-05-02 DIAGNOSIS — Z8249 Family history of ischemic heart disease and other diseases of the circulatory system: Secondary | ICD-10-CM | POA: Diagnosis not present

## 2019-05-02 DIAGNOSIS — Z888 Allergy status to other drugs, medicaments and biological substances status: Secondary | ICD-10-CM | POA: Diagnosis not present

## 2019-05-02 DIAGNOSIS — Z01812 Encounter for preprocedural laboratory examination: Secondary | ICD-10-CM | POA: Insufficient documentation

## 2019-05-02 DIAGNOSIS — M810 Age-related osteoporosis without current pathological fracture: Secondary | ICD-10-CM | POA: Diagnosis not present

## 2019-05-02 DIAGNOSIS — Z1159 Encounter for screening for other viral diseases: Secondary | ICD-10-CM | POA: Insufficient documentation

## 2019-05-02 DIAGNOSIS — M1990 Unspecified osteoarthritis, unspecified site: Secondary | ICD-10-CM | POA: Diagnosis not present

## 2019-05-02 DIAGNOSIS — D696 Thrombocytopenia, unspecified: Secondary | ICD-10-CM | POA: Diagnosis not present

## 2019-05-02 DIAGNOSIS — N840 Polyp of corpus uteri: Secondary | ICD-10-CM | POA: Diagnosis present

## 2019-05-02 DIAGNOSIS — F419 Anxiety disorder, unspecified: Secondary | ICD-10-CM | POA: Diagnosis not present

## 2019-05-02 LAB — CBC
HCT: 36.7 % (ref 36.0–46.0)
Hemoglobin: 12.2 g/dL (ref 12.0–15.0)
MCH: 32.3 pg (ref 26.0–34.0)
MCHC: 33.2 g/dL (ref 30.0–36.0)
MCV: 97.1 fL (ref 80.0–100.0)
Platelets: 108 10*3/uL — ABNORMAL LOW (ref 150–400)
RBC: 3.78 MIL/uL — ABNORMAL LOW (ref 3.87–5.11)
RDW: 12.5 % (ref 11.5–15.5)
WBC: 3 10*3/uL — ABNORMAL LOW (ref 4.0–10.5)
nRBC: 0 % (ref 0.0–0.2)

## 2019-05-02 LAB — BASIC METABOLIC PANEL
Anion gap: 4 — ABNORMAL LOW (ref 5–15)
BUN: 17 mg/dL (ref 8–23)
CO2: 29 mmol/L (ref 22–32)
Calcium: 9.1 mg/dL (ref 8.9–10.3)
Chloride: 106 mmol/L (ref 98–111)
Creatinine, Ser: 0.88 mg/dL (ref 0.44–1.00)
GFR calc Af Amer: >60 mL/min (ref >60)
GFR calc non Af Amer: >60 mL/min (ref >60)
Glucose, Bld: 90 mg/dL (ref 70–99)
Potassium: 4.5 mmol/L (ref 3.5–5.1)
Sodium: 139 mmol/L (ref 135–145)

## 2019-05-03 LAB — NOVEL CORONAVIRUS, NAA (HOSP ORDER, SEND-OUT TO REF LAB; TAT 18-24 HRS): SARS-CoV-2, NAA: NOT DETECTED

## 2019-05-03 NOTE — Anesthesia Preprocedure Evaluation (Addendum)
Anesthesia Evaluation  Patient identified by MRN, date of birth, ID band Patient awake    Reviewed: Allergy & Precautions, NPO status , Patient's Chart, lab work & pertinent test results  History of Anesthesia Complications Negative for: history of anesthetic complications  Airway Mallampati: II  TM Distance: >3 FB Neck ROM: Full    Dental no notable dental hx.    Pulmonary former smoker,    Pulmonary exam normal        Cardiovascular negative cardio ROS Normal cardiovascular exam     Neuro/Psych  Headaches, Anxiety    GI/Hepatic negative GI ROS, Neg liver ROS,   Endo/Other  negative endocrine ROS  Renal/GU negative Renal ROS     Musculoskeletal  (+) Arthritis ,   Abdominal   Peds  Hematology Plts 108   Anesthesia Other Findings Day of surgery medications reviewed with the patient.  Reproductive/Obstetrics Endometrial polyp                            Anesthesia Physical Anesthesia Plan  ASA: II  Anesthesia Plan: General   Post-op Pain Management:    Induction: Intravenous  PONV Risk Score and Plan: Ondansetron and Treatment may vary due to age or medical condition  Airway Management Planned: LMA  Additional Equipment:   Intra-op Plan:   Post-operative Plan: Extubation in OR  Informed Consent: I have reviewed the patients History and Physical, chart, labs and discussed the procedure including the risks, benefits and alternatives for the proposed anesthesia with the patient or authorized representative who has indicated his/her understanding and acceptance.     Dental advisory given  Plan Discussed with: CRNA  Anesthesia Plan Comments:        Anesthesia Quick Evaluation

## 2019-05-04 NOTE — Progress Notes (Signed)
SPOKE W/  _pt     SCREENING SYMPTOMS OF COVID 19:   COUGH--no  RUNNY NOSE--- no  SORE THROAT---no  NASAL CONGESTION----no  SNEEZING----no  SHORTNESS OF BREATH---no  DIFFICULTY BREATHING---no  TEMP >100.0 -----no  UNEXPLAINED BODY ACHES------no  CHILLS -------- no  HEADACHES ---------no  LOSS OF SMELL/ TASTE --------no    HAVE YOU OR ANY FAMILY MEMBER TRAVELLED PAST 14 DAYS OUT OF THE   COUNTY---no STATE----no COUNTRY----no  HAVE YOU OR ANY FAMILY MEMBER BEEN EXPOSED TO ANYONE WITH COVID 19? no   SPOKE W/  _pt     SCREENING SYMPTOMS OF COVID 19:   COUGH--no  RUNNY NOSE--- no  SORE THROAT---no  NASAL CONGESTION----no  SNEEZING----no  SHORTNESS OF BREATH---no  DIFFICULTY BREATHING---no  TEMP >100.0 -----no  UNEXPLAINED BODY ACHES----no--  CHILLS --------no   HEADACHES ---------no LOSS OF SMELL/ TASTE --------no    HAVE YOU OR ANY FAMILY MEMBER TRAVELLED PAST 14 DAYS OUT OF THE   COUNTY---no STATE----no COUNTRY----no  HAVE YOU OR ANY FAMILY MEMBER BEEN EXPOSED TO ANYONE WITH COVID 19? no

## 2019-05-05 ENCOUNTER — Ambulatory Visit (HOSPITAL_BASED_OUTPATIENT_CLINIC_OR_DEPARTMENT_OTHER): Payer: Medicare HMO | Admitting: Physician Assistant

## 2019-05-05 ENCOUNTER — Ambulatory Visit (HOSPITAL_BASED_OUTPATIENT_CLINIC_OR_DEPARTMENT_OTHER)
Admission: RE | Admit: 2019-05-05 | Discharge: 2019-05-05 | Disposition: A | Discharge status: Home / Self Care | Payer: Medicare HMO | Attending: Obstetrics & Gynecology | Admitting: Obstetrics & Gynecology

## 2019-05-05 ENCOUNTER — Encounter (HOSPITAL_BASED_OUTPATIENT_CLINIC_OR_DEPARTMENT_OTHER): Payer: Self-pay | Admitting: *Deleted

## 2019-05-05 ENCOUNTER — Encounter (HOSPITAL_BASED_OUTPATIENT_CLINIC_OR_DEPARTMENT_OTHER)
Admission: RE | Disposition: A | Discharge status: Home / Self Care | Payer: Self-pay | Source: Home / Self Care | Attending: Obstetrics & Gynecology

## 2019-05-05 ENCOUNTER — Other Ambulatory Visit: Payer: Self-pay

## 2019-05-05 ENCOUNTER — Ambulatory Visit (HOSPITAL_BASED_OUTPATIENT_CLINIC_OR_DEPARTMENT_OTHER): Payer: Medicare HMO | Admitting: Anesthesiology

## 2019-05-05 DIAGNOSIS — Z833 Family history of diabetes mellitus: Secondary | ICD-10-CM | POA: Insufficient documentation

## 2019-05-05 DIAGNOSIS — Z1159 Encounter for screening for other viral diseases: Secondary | ICD-10-CM | POA: Insufficient documentation

## 2019-05-05 DIAGNOSIS — D72819 Decreased white blood cell count, unspecified: Secondary | ICD-10-CM | POA: Diagnosis not present

## 2019-05-05 DIAGNOSIS — M719 Bursopathy, unspecified: Secondary | ICD-10-CM | POA: Insufficient documentation

## 2019-05-05 DIAGNOSIS — D696 Thrombocytopenia, unspecified: Secondary | ICD-10-CM | POA: Diagnosis not present

## 2019-05-05 DIAGNOSIS — Z888 Allergy status to other drugs, medicaments and biological substances status: Secondary | ICD-10-CM | POA: Insufficient documentation

## 2019-05-05 DIAGNOSIS — F419 Anxiety disorder, unspecified: Secondary | ICD-10-CM | POA: Insufficient documentation

## 2019-05-05 DIAGNOSIS — Z79899 Other long term (current) drug therapy: Secondary | ICD-10-CM | POA: Diagnosis not present

## 2019-05-05 DIAGNOSIS — Z823 Family history of stroke: Secondary | ICD-10-CM | POA: Insufficient documentation

## 2019-05-05 DIAGNOSIS — M5136 Other intervertebral disc degeneration, lumbar region: Secondary | ICD-10-CM | POA: Diagnosis not present

## 2019-05-05 DIAGNOSIS — N840 Polyp of corpus uteri: Secondary | ICD-10-CM | POA: Insufficient documentation

## 2019-05-05 DIAGNOSIS — K5909 Other constipation: Secondary | ICD-10-CM | POA: Diagnosis not present

## 2019-05-05 DIAGNOSIS — M1990 Unspecified osteoarthritis, unspecified site: Secondary | ICD-10-CM | POA: Diagnosis not present

## 2019-05-05 DIAGNOSIS — R9389 Abnormal findings on diagnostic imaging of other specified body structures: Secondary | ICD-10-CM | POA: Insufficient documentation

## 2019-05-05 DIAGNOSIS — M810 Age-related osteoporosis without current pathological fracture: Secondary | ICD-10-CM | POA: Insufficient documentation

## 2019-05-05 DIAGNOSIS — N8500 Endometrial hyperplasia, unspecified: Secondary | ICD-10-CM | POA: Diagnosis not present

## 2019-05-05 DIAGNOSIS — R69 Illness, unspecified: Secondary | ICD-10-CM | POA: Diagnosis not present

## 2019-05-05 DIAGNOSIS — Z801 Family history of malignant neoplasm of trachea, bronchus and lung: Secondary | ICD-10-CM | POA: Insufficient documentation

## 2019-05-05 DIAGNOSIS — G43909 Migraine, unspecified, not intractable, without status migrainosus: Secondary | ICD-10-CM | POA: Diagnosis not present

## 2019-05-05 DIAGNOSIS — Z8249 Family history of ischemic heart disease and other diseases of the circulatory system: Secondary | ICD-10-CM | POA: Insufficient documentation

## 2019-05-05 DIAGNOSIS — Z91040 Latex allergy status: Secondary | ICD-10-CM | POA: Insufficient documentation

## 2019-05-05 DIAGNOSIS — Z87891 Personal history of nicotine dependence: Secondary | ICD-10-CM | POA: Insufficient documentation

## 2019-05-05 HISTORY — DX: Other constipation: K59.09

## 2019-05-05 HISTORY — DX: Contusion of unspecified lower leg, initial encounter: S80.10XA

## 2019-05-05 HISTORY — DX: Reserved for concepts with insufficient information to code with codable children: IMO0002

## 2019-05-05 HISTORY — PX: DILATATION & CURETTAGE/HYSTEROSCOPY WITH MYOSURE: SHX6511

## 2019-05-05 SURGERY — DILATATION & CURETTAGE/HYSTEROSCOPY WITH MYOSURE
Anesthesia: General

## 2019-05-05 MED ORDER — PROMETHAZINE HCL 25 MG/ML IJ SOLN
6.2500 mg | INTRAMUSCULAR | Status: DC | PRN
  Filled 2019-05-05: qty 1

## 2019-05-05 MED ORDER — SCOPOLAMINE 1 MG/3DAYS TD PT72
MEDICATED_PATCH | TRANSDERMAL | Status: AC
  Filled 2019-05-05: qty 1

## 2019-05-05 MED ORDER — LIDOCAINE HCL (CARDIAC) PF 100 MG/5ML IV SOSY
PREFILLED_SYRINGE | INTRAVENOUS | Status: DC | PRN
  Administered 2019-05-05: 40 mg via INTRAVENOUS

## 2019-05-05 MED ORDER — FENTANYL CITRATE (PF) 100 MCG/2ML IJ SOLN
25.0000 ug | INTRAMUSCULAR | Status: DC | PRN
  Filled 2019-05-05: qty 1

## 2019-05-05 MED ORDER — ONDANSETRON HCL 4 MG/2ML IJ SOLN
INTRAMUSCULAR | Status: DC | PRN
  Administered 2019-05-05: 4 mg via INTRAVENOUS

## 2019-05-05 MED ORDER — HYDROCODONE-ACETAMINOPHEN 5-325 MG PO TABS: 1.0000 | ORAL_TABLET | Freq: Four times a day (QID) | ORAL | 0 refills | Status: DC | PRN

## 2019-05-05 MED ORDER — ACETAMINOPHEN 500 MG PO TABS
1000.0000 mg | ORAL_TABLET | Freq: Once | ORAL | Status: AC
  Administered 2019-05-05: 1000 mg via ORAL
  Filled 2019-05-05: qty 2

## 2019-05-05 MED ORDER — PROPOFOL 10 MG/ML IV BOLUS
INTRAVENOUS | Status: DC | PRN
  Administered 2019-05-05: 100 mg via INTRAVENOUS

## 2019-05-05 MED ORDER — IBUPROFEN 800 MG PO TABS: 800.0000 mg | ORAL_TABLET | Freq: Three times a day (TID) | ORAL | 0 refills | Status: DC | PRN

## 2019-05-05 MED ORDER — ACETAMINOPHEN 500 MG PO TABS
ORAL_TABLET | ORAL | Status: AC
  Filled 2019-05-05: qty 2

## 2019-05-05 MED ORDER — PROPOFOL 10 MG/ML IV BOLUS
INTRAVENOUS | Status: AC
  Filled 2019-05-05: qty 40

## 2019-05-05 MED ORDER — LACTATED RINGERS IV SOLN
INTRAVENOUS | Status: DC
  Administered 2019-05-05: 08:00:00 via INTRAVENOUS
  Filled 2019-05-05: qty 1000

## 2019-05-05 MED ORDER — LIDOCAINE 2% (20 MG/ML) 5 ML SYRINGE
INTRAMUSCULAR | Status: AC
  Filled 2019-05-05: qty 5

## 2019-05-05 MED ORDER — FENTANYL CITRATE (PF) 100 MCG/2ML IJ SOLN
INTRAMUSCULAR | Status: AC
  Filled 2019-05-05: qty 2

## 2019-05-05 MED ORDER — KETOROLAC TROMETHAMINE 30 MG/ML IJ SOLN
INTRAMUSCULAR | Status: AC
  Filled 2019-05-05: qty 1

## 2019-05-05 MED ORDER — EPHEDRINE SULFATE 50 MG/ML IJ SOLN
INTRAMUSCULAR | Status: DC | PRN
  Administered 2019-05-05 (×2): 10 mg via INTRAVENOUS

## 2019-05-05 MED ORDER — SCOPOLAMINE 1 MG/3DAYS TD PT72
1.0000 | MEDICATED_PATCH | TRANSDERMAL | Status: DC
  Administered 2019-05-05: 1.5 mg via TRANSDERMAL
  Filled 2019-05-05: qty 1

## 2019-05-05 MED ORDER — KETOROLAC TROMETHAMINE 15 MG/ML IJ SOLN
INTRAMUSCULAR | Status: DC | PRN
  Administered 2019-05-05: 15 mg via INTRAVENOUS

## 2019-05-05 MED ORDER — ONDANSETRON HCL 4 MG/2ML IJ SOLN
INTRAMUSCULAR | Status: AC
  Filled 2019-05-05: qty 2

## 2019-05-05 MED ORDER — LIDOCAINE-EPINEPHRINE 1 %-1:100000 IJ SOLN
INTRAMUSCULAR | Status: DC | PRN
  Administered 2019-05-05: 10 mL

## 2019-05-05 MED ORDER — EPHEDRINE 5 MG/ML INJ
INTRAVENOUS | Status: AC
  Filled 2019-05-05: qty 10

## 2019-05-05 MED ORDER — FENTANYL CITRATE (PF) 100 MCG/2ML IJ SOLN
INTRAMUSCULAR | Status: DC | PRN
  Administered 2019-05-05 (×2): 50 ug via INTRAVENOUS

## 2019-05-05 SURGICAL SUPPLY — 24 items
BIPOLAR CUTTING LOOP 21FR (ELECTRODE)
CANISTER SUCT 3000ML PPV (MISCELLANEOUS) ×4 IMPLANT
CATH ROBINSON RED A/P 16FR (CATHETERS) ×2 IMPLANT
COVER WAND RF STERILE (DRAPES) ×2 IMPLANT
DEVICE MYOSURE LITE (MISCELLANEOUS) ×1 IMPLANT
DEVICE MYOSURE REACH (MISCELLANEOUS) IMPLANT
DILATOR CANAL MILEX (MISCELLANEOUS) IMPLANT
GAUZE 4X4 16PLY RFD (DISPOSABLE) ×2 IMPLANT
GLOVE BIOGEL PI IND STRL 7.0 (GLOVE) ×1 IMPLANT
GLOVE BIOGEL PI INDICATOR 7.0 (GLOVE) ×1
GLOVE ECLIPSE 6.5 STRL STRAW (GLOVE) ×4 IMPLANT
GOWN STRL REUS W/ TWL LRG LVL3 (GOWN DISPOSABLE) ×1 IMPLANT
GOWN STRL REUS W/ TWL XL LVL3 (GOWN DISPOSABLE) ×1 IMPLANT
GOWN STRL REUS W/TWL LRG LVL3 (GOWN DISPOSABLE) ×2
GOWN STRL REUS W/TWL XL LVL3 (GOWN DISPOSABLE) ×2
IV NS IRRIG 3000ML ARTHROMATIC (IV SOLUTION) ×4 IMPLANT
KIT PROCEDURE FLUENT (KITS) ×2 IMPLANT
KIT TURNOVER CYSTO (KITS) ×2 IMPLANT
LOOP CUTTING BIPOLAR 21FR (ELECTRODE) IMPLANT
PACK VAGINAL MINOR WOMEN LF (CUSTOM PROCEDURE TRAY) ×2 IMPLANT
PAD OB MATERNITY 4.3X12.25 (PERSONAL CARE ITEMS) ×2 IMPLANT
SEAL ROD LENS SCOPE MYOSURE (ABLATOR) ×2 IMPLANT
TOWEL OR 17X26 10 PK STRL BLUE (TOWEL DISPOSABLE) ×4 IMPLANT
WATER STERILE IRR 500ML POUR (IV SOLUTION) ×2 IMPLANT

## 2019-05-05 NOTE — Anesthesia Postprocedure Evaluation (Signed)
Anesthesia Post Note  Patient: AURIEL KIST  Procedure(s) Performed: DILATATION & CURETTAGE/HYSTEROSCOPY WITH MYOSURE RESECTION OF ENDOMETRIAL LESION (N/A )     Patient location during evaluation: PACU Anesthesia Type: General Level of consciousness: awake and alert Pain management: pain level controlled Vital Signs Assessment: post-procedure vital signs reviewed and stable Respiratory status: spontaneous breathing, nonlabored ventilation and respiratory function stable Cardiovascular status: blood pressure returned to baseline and stable Postop Assessment: no apparent nausea or vomiting Anesthetic complications: no    Last Vitals:  Vitals:   05/05/19 0945 05/05/19 0955  BP: 117/62 119/67  Pulse: 74 69  Resp: 14 17  Temp:    SpO2: 98% 99%    Last Pain:  Vitals:   05/05/19 0955  TempSrc:   PainSc: 0-No pain                 Brennan Bailey

## 2019-05-05 NOTE — Discharge Instructions (Signed)

## 2019-05-05 NOTE — Transfer of Care (Signed)
   Immediate Anesthesia Transfer of Care Note  Patient: [name]  Procedure(s) Performed: Procedure(s) (LRB): DILATATION & CURETTAGE/HYSTEROSCOPY WITH MYOSURE RESECTION OF ENDOMETRIAL LESION (N/A)  Patient Location: PACU  Anesthesia Type: General  Level of Consciousness: awake, sedated, patient cooperative and responds to stimulation  Airway & Oxygen Therapy: Patient Spontanous Breathing and Patient connected to [place] O2 with soft Face mask   Post-op Assessment: Report given to PACU RN, Post -op Vital signs reviewed and stable and Patient moving all extremities  Post vital signs: Reviewed and stable  Complications: No apparent anesthesia complications

## 2019-05-05 NOTE — Op Note (Signed)
05/05/2019  9:05 AM  PATIENT:  [name]  68 y.o. female  PRE-OPERATIVE DIAGNOSIS:  endometrial polyp, thickened endometrium  POST-OPERATIVE DIAGNOSIS:  Endometrial scar, thickened endometrium  PROCEDURE:  Procedure(s): DILATATION & CURETTAGE/HYSTEROSCOPY WITH MYOSURE RESECTION OF ENDOMETRIAL LESION  SURGEON:  Megan Salon  ASSISTANTS: OR staff   ANESTHESIA:   general  ESTIMATED BLOOD LOSS: 5 mL  BLOOD ADMINISTERED:none   FLUIDS: 300ccLR  UOP: did not cath pt as she voided right before going back to the OR  SPECIMEN:  Endometrial tissue and curettings  DISPOSITION OF SPECIMEN:  PATHOLOGY  FINDINGS: lesion inside endometrial cavity appears to be a scar and not a polyp.  Endometrium is thin  DESCRIPTION OF OPERATION: Patient was taken to the operating room.  She is placed in the supine position. SCDs were on her lower extremities and functioning properly. General anesthesia with an LMA was administered without difficulty. Dr. Daiva Huge, anesthesia, oversaw case.  Legs were then placed in the [place] in the low lithotomy position. The legs were lifted to the high lithotomy position and the Betadine prep was used on the inner thighs perineum and vagina x3. Patient was draped in a normal standard fashion. An in and out catheterization was not performed as pt voided right before going back to the OR.  A bivalve speculum was placed the vagina. The anterior lip of the cervix was grasped with single-tooth tenaculum.  A paracervical block of 1% lidocaine mixed one-to-one with epinephrine (1:100,000 units).  10 cc was used total. The cervix is dilated up to [facility] dilators. The endometrial cavity sounded to 6 cm.   A Myosure lite hysteroscope was obtained. Normal saline was used as a hysteroscopic fluid. The hysteroscope was advanced through the endocervical canal into the endometrial cavity. The tubal ostia were noted bilaterally. Additional findings included lesion on left  inferior side of endometrial cavity that connected the mid portion of the posterior endometrial cavity to the left side of the endometrial cavity.  This appears to be more like scar as it is filmy instead of like a polyp.  The Myosure lite was used to remove this finding.  The hysteroscope was removed. A #1 toothed curette was used to curette the cavity until rough gritty texture was noted in all quadrants. With revisualization of the hysteroscope, there was no longer any abnormal findings.  A second curetting was then performed.  At this point no other procedure was needed and this procedure was ended. The hysteroscope was removed. The fluid deficit was 100 cc. The tenaculum was removed from the anterior lip of the cervix. The speculum was removed from the vagina. The prep was cleansed of the patient's skin. The legs are positioned back in the supine position. Sponge, lap, needle, initially counts were correct x2. Patient was taken to recovery in stable condition.  COUNTS:  YES  PLAN OF CARE: Transfer to PACU

## 2019-05-05 NOTE — H&P (Signed)
[name] is an 68 y.o. female G0 MWF here for hysteroscopy with polyp resection, D&C due to endometrial polyp and thickened endometrium.  Risks, benefits and alternatives have been discussed and she is here and ready to proceed.  Pertinent Gynecological History: Menses: post-menopausal Bleeding: none Contraception: post menopausal status DES exposure: denies Blood transfusions: none Sexually transmitted diseases: no past history Previous GYN Procedures: none  Last mammogram: normal Date: 02/09/2019 Last pap: normal Date: 12/08 OB History: G0, P0   Menstrual History: Patient's last menstrual period was 12/14/1985.    Past Medical History:  Diagnosis Date  . Anxiety   . Arthritis   . Bursitis   . Chronic constipation    takes magnesium and probiotic  . Chronic leukopenia    previouly followed by dr Marin Olp  . Chronic migraine    neurology-- barbara beck PA ([facility])  . DDD (degenerative disc disease), lumbar 2011   Moderate  . Endometrial thickening on ultrasound   . Environmental and seasonal allergies   . Hematoma of leg    due to trauma in her 58s  . Hepatic cyst 2011   Multiple hepatic cysts, largest measuring 5 cm  . Lumbar spondylosis   . Migraines   . Osteoporosis   . PMB (postmenopausal bleeding)   . Thrombocytopenia ([place])    chronic  (previously followed by dr Marin Olp, released 2015)  . Wears glasses     Past Surgical History:  Procedure Laterality Date  . ANAL FISSURE REPAIR    . BLEPHAROPLASTY Bilateral   . BUNIONECTOMY Bilateral   . COLONOSCOPY  last one 2011  . HARDWARE REMOVAL  04/14/2012   Procedure: HARDWARE REMOVAL;  Surgeon: Linna Hoff, MD;  Location: [place];  Service: Orthopedics;  Laterality: Left;  left wrist deep hardware excision  . ORIF WRIST FRACTURE Left 2011  . THUMB SURGERY Right 04/17/2015    Family History  Problem Relation Age of Onset  . Diabetes Mother   . Hypertension Mother   .  Stroke Mother   . Dementia Mother   . Heart disease Father   . Cancer Father        lung cancer    Social History:  reports that she quit smoking about 33 years ago. Her smoking use included cigarettes. She started smoking about 39 years ago. She has a 3.50 pack-year smoking history. She has never used smokeless tobacco. She reports that she does not drink alcohol or use drugs.  Allergies:  Allergies  Allergen Reactions  . Corticosteroids Anxiety  . Levonorgestrel-Ethinyl Estrad Itching  . Cortisone   . Other     Allergy shots caused thrombocyopenia  . Latex Rash    Medications Prior to Admission  Medication Sig Dispense Refill Last Dose  . alendronate (FOSAMAX) 70 MG tablet Take 1 tablet (70 mg total) by mouth once a week. Take with a full glass of water on an empty stomach. 12 tablet 4 Taking  . BIOTIN PO Take by mouth.   Taking  . Cholecalciferol (VITAMIN D3) 1000 UNITS CAPS Take by mouth daily.   Taking  . citalopram (CELEXA) 20 MG tablet Take by mouth every morning.    Taking  . clonazePAM (KLONOPIN) 1 MG tablet Take 1 tablet (1 mg total) by mouth daily. (Patient taking differently: Take 1 mg by mouth every evening. ) 30 tablet 1 Taking  . cyanocobalamin ([employer] VITAMIN B12) 1000 MCG tablet Take by mouth.   Taking  . Fremanezumab-vfrm (  AJOVY) 225 MG/1.5ML SOSY Inject into the skin.   Taking  . ipratropium (ATROVENT) 0.06 % nasal spray as needed.    Taking  . Magnesium 500 MG TABS Take by mouth daily.   Taking  . Melatonin 10 MG TABS Take by mouth at bedtime.   Taking  . Probiotic Product (PROBIOTIC DAILY PO) Take by mouth.   Taking  . promethazine (PHENERGAN) 12.5 MG tablet Take 12.5 mg by mouth every 6 (six) hours as needed.     Taking  . pyridoxine (B-6) 100 MG tablet Take by mouth.   Taking  . SUMAtriptan (IMITREX) 100 MG tablet TAKE 1 TABLET BY MOUTH FOR MIGRAINE.MAY REPEAT IN 2 HRS. NO MORE THAN 2TABS IN 24HRS OR 2-3DAYS/WK  1 Taking    Review of Systems  All other  systems reviewed and are negative.   Blood pressure 95/60, pulse 60, temperature 98.3 F (36.8 C), temperature source Oral, resp. rate 14, height 5' 0.75" (1.543 m), weight 49.9 kg, last menstrual period 12/14/1985, SpO2 99 %. Physical Exam  Constitutional: She is oriented to person, place, and time. She appears well-developed and well-nourished.  Cardiovascular: Normal rate and regular rhythm.  Respiratory: Effort normal and breath sounds normal.  Neurological: She is alert and oriented to person, place, and time.  Skin: Skin is warm and dry.  Psychiatric: She has a normal mood and affect.    No results found for this or any previous visit (from the past 24 hour(s)).  No results found.  Assessment/Plan: 68 yo G0 MWF here for hysteroscopy and polyp resection, D&C.  Questions answered.    Megan Salon 05/05/2019, 7:23 AM

## 2019-05-05 NOTE — Anesthesia Procedure Notes (Signed)
Procedure Name: LMA Insertion Date/Time: 05/05/2019 8:38 AM Performed by: Justice Rocher, CRNA Pre-anesthesia Checklist: Patient identified, Emergency Drugs available, Suction available and Patient being monitored Patient Re-evaluated:Patient Re-evaluated prior to induction Oxygen Delivery Method: Circle system utilized Preoxygenation: Pre-oxygenation with 100% oxygen Induction Type: IV induction Ventilation: Mask ventilation without difficulty LMA: LMA inserted LMA Size: 3.0 Number of attempts: 1 Airway Equipment and Method: Bite block Placement Confirmation: positive ETCO2 and breath sounds checked- equal and bilateral Tube secured with: Tape Dental Injury: Teeth and Oropharynx as per pre-operative assessment

## 2019-05-09 ENCOUNTER — Encounter (HOSPITAL_BASED_OUTPATIENT_CLINIC_OR_DEPARTMENT_OTHER): Payer: Self-pay | Admitting: Obstetrics & Gynecology

## 2019-05-12 ENCOUNTER — Telehealth: Payer: Self-pay | Admitting: *Deleted

## 2019-05-12 NOTE — Telephone Encounter (Signed)
-----   Message from Megan Salon, MD sent at 05/12/2019 12:37 PM EDT ----- Please call pt to schedule post op visit with me.  Should be seen in 2-3 weeks.

## 2019-05-12 NOTE — Telephone Encounter (Signed)
LM for pt to call back to schedule Post op appt.

## 2019-05-15 NOTE — Telephone Encounter (Signed)
Patient scheduled 05/25/19

## 2019-05-15 NOTE — Telephone Encounter (Signed)
LM for pt to call back. Second attempt

## 2019-05-19 ENCOUNTER — Other Ambulatory Visit: Payer: Self-pay

## 2019-05-19 ENCOUNTER — Ambulatory Visit
Admission: RE | Admit: 2019-05-19 | Discharge: 2019-05-19 | Disposition: A | Discharge status: Home / Self Care | Payer: Medicare HMO | Source: Ambulatory Visit | Attending: Obstetrics & Gynecology | Admitting: Obstetrics & Gynecology

## 2019-05-19 DIAGNOSIS — E2839 Other primary ovarian failure: Secondary | ICD-10-CM

## 2019-05-19 DIAGNOSIS — Z780 Asymptomatic menopausal state: Secondary | ICD-10-CM | POA: Diagnosis not present

## 2019-05-19 DIAGNOSIS — Z1231 Encounter for screening mammogram for malignant neoplasm of breast: Secondary | ICD-10-CM

## 2019-05-19 DIAGNOSIS — M810 Age-related osteoporosis without current pathological fracture: Secondary | ICD-10-CM | POA: Diagnosis not present

## 2019-05-19 DIAGNOSIS — M8588 Other specified disorders of bone density and structure, other site: Secondary | ICD-10-CM | POA: Diagnosis not present

## 2019-05-22 ENCOUNTER — Telehealth: Payer: Self-pay | Admitting: Obstetrics & Gynecology

## 2019-05-22 ENCOUNTER — Encounter: Payer: Self-pay | Admitting: Obstetrics & Gynecology

## 2019-05-22 NOTE — Telephone Encounter (Signed)
Advised patient BMD is marked "advanced age" under indications for testing per the radiologist. Told patient this just means she is over 16 and eligible for test. This is not a diagnosis. Reviewed BMD results with her-- she had reviewed on MyChart also. She was worried they were stating her bones were that of an "68 y.o." instead of her actual 35yrs.She thanked me for speaking with her.

## 2019-05-22 NOTE — Telephone Encounter (Signed)
Patient sent the following correspondence through [place]. Routing to triage to assist patient with request.  Hi Dr. Sabra Heck, What does advanced age 68?   does it mean that my bones are aging faster than my current age? I do a lot of things that people younger than I cannot do. People my age and younger are always asking me how do I sit down and get back up without using my hands. Just an example. 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 Just wondering. Thanks, [name]

## 2019-05-25 ENCOUNTER — Ambulatory Visit: Payer: Medicare HMO | Admitting: Obstetrics & Gynecology

## 2019-05-26 ENCOUNTER — Ambulatory Visit (INDEPENDENT_AMBULATORY_CARE_PROVIDER_SITE_OTHER): Payer: Medicare HMO | Admitting: Obstetrics & Gynecology

## 2019-05-26 ENCOUNTER — Other Ambulatory Visit: Payer: Self-pay

## 2019-05-26 ENCOUNTER — Encounter: Payer: Self-pay | Admitting: Obstetrics & Gynecology

## 2019-05-26 DIAGNOSIS — Z1211 Encounter for screening for malignant neoplasm of colon: Secondary | ICD-10-CM | POA: Diagnosis not present

## 2019-05-26 DIAGNOSIS — Z1212 Encounter for screening for malignant neoplasm of rectum: Secondary | ICD-10-CM

## 2019-05-26 DIAGNOSIS — R1032 Left lower quadrant pain: Secondary | ICD-10-CM | POA: Diagnosis not present

## 2019-05-26 NOTE — Progress Notes (Signed)
Virtual Visit via Telephone Note  I connected with [name] on 05/26/19 at 11:30 AM EDT by telephone and verified that I am speaking with the correct person using two identifiers.  Location: Patient: home Provider: office   I discussed the limitations, risks, security and privacy concerns of performing an evaluation and management service by telephone and the availability of in person appointments. I also discussed with the patient that there may be a patient responsible charge related to this service. The patient expressed understanding and agreed to proceed.   History of Present Illness: 68 yo G0 MWF with history of thickened endometrium and endometrial polyp who underwent a hysteroscopic resection with D&C on 05/05/2019.  Pathology did show a polyp that was benign.    She denies cramping or vaginal bleeding.  She feels good.  She is having some lower abdominal distension.  She has chronic constipation and she uses magnesium daily.  Her lower pelvic/abdominal cramping does not seem to have changed.  She's had this for years as well as chronically loose bowel movements.  She really never has a formed bowel movement.  Her last colonoscopy was 2011.  This was done in [facility] and she cannot remember the provider.  Denies blood in stool.     Observations/Objective: WNWD WF, NAD, A&O x 3, psyche: normal  Assessment and Plan: S/p hysteroscopic resection of polyp with D&C, negative pathology Lower pelvic/abdominal distension, cramping, loose stools  Follow Up Instructions: Recommended proceeding with CT if can precert and then referral to GI for repeat colonoscopy if CT is negative.  Pt is comfortable with plan.  Will wait until 4 weeks post op.  She will call back at that time for scheduling.     I discussed the assessment and treatment plan with the patient. The patient was provided an opportunity to ask questions and all were answered. The patient agreed with the plan and demonstrated  an understanding of the instructions.   I provided 15 minutes of non-face-to-face time during this encounter.   Megan Salon, MD

## 2019-05-27 ENCOUNTER — Encounter: Payer: Self-pay | Admitting: Obstetrics & Gynecology

## 2019-05-30 DIAGNOSIS — S01511A Laceration without foreign body of lip, initial encounter: Secondary | ICD-10-CM | POA: Diagnosis not present

## 2019-06-01 ENCOUNTER — Ambulatory Visit: Payer: Medicare HMO | Admitting: Obstetrics & Gynecology

## 2019-06-08 DIAGNOSIS — Z1211 Encounter for screening for malignant neoplasm of colon: Secondary | ICD-10-CM | POA: Diagnosis not present

## 2019-06-08 DIAGNOSIS — K5904 Chronic idiopathic constipation: Secondary | ICD-10-CM | POA: Diagnosis not present

## 2019-06-08 DIAGNOSIS — R140 Abdominal distension (gaseous): Secondary | ICD-10-CM | POA: Diagnosis not present

## 2019-07-31 DIAGNOSIS — Z1211 Encounter for screening for malignant neoplasm of colon: Secondary | ICD-10-CM | POA: Diagnosis not present

## 2019-08-07 ENCOUNTER — Encounter: Payer: Self-pay | Admitting: Obstetrics & Gynecology

## 2020-01-04 DIAGNOSIS — H524 Presbyopia: Secondary | ICD-10-CM | POA: Diagnosis not present

## 2020-01-16 DIAGNOSIS — H18413 Arcus senilis, bilateral: Secondary | ICD-10-CM | POA: Diagnosis not present

## 2020-01-16 DIAGNOSIS — H40013 Open angle with borderline findings, low risk, bilateral: Secondary | ICD-10-CM | POA: Diagnosis not present

## 2020-01-16 DIAGNOSIS — H25043 Posterior subcapsular polar age-related cataract, bilateral: Secondary | ICD-10-CM | POA: Diagnosis not present

## 2020-01-16 DIAGNOSIS — H2511 Age-related nuclear cataract, right eye: Secondary | ICD-10-CM | POA: Diagnosis not present

## 2020-01-16 DIAGNOSIS — H2513 Age-related nuclear cataract, bilateral: Secondary | ICD-10-CM | POA: Diagnosis not present

## 2020-01-25 ENCOUNTER — Ambulatory Visit: Payer: Medicare HMO | Admitting: Obstetrics & Gynecology

## 2020-01-25 ENCOUNTER — Other Ambulatory Visit: Payer: Self-pay

## 2020-01-25 ENCOUNTER — Encounter: Payer: Self-pay | Admitting: Obstetrics & Gynecology

## 2020-01-25 ENCOUNTER — Telehealth: Payer: Self-pay | Admitting: Obstetrics & Gynecology

## 2020-01-25 VITALS — BP 102/60 | HR 64 | Temp 96.6°F | Resp 10 | Wt 110.0 lb

## 2020-01-25 DIAGNOSIS — R300 Dysuria: Secondary | ICD-10-CM | POA: Diagnosis not present

## 2020-01-25 DIAGNOSIS — R350 Frequency of micturition: Secondary | ICD-10-CM

## 2020-01-25 LAB — POCT URINALYSIS DIPSTICK
Bilirubin, UA: NEGATIVE
Glucose, UA: NEGATIVE
Ketones, UA: NEGATIVE
Nitrite, UA: NEGATIVE
Protein, UA: POSITIVE — AB
Urobilinogen, UA: 0.2 E.U./dL
pH, UA: 5 (ref 5.0–8.0)

## 2020-01-25 MED ORDER — SULFAMETHOXAZOLE-TRIMETHOPRIM 800-160 MG PO TABS: 1.0000 | ORAL_TABLET | Freq: Two times a day (BID) | ORAL | 0 refills | Status: DC

## 2020-01-25 NOTE — Telephone Encounter (Signed)
Patient thinks she may have a uti. To triage to assist with scheduling with Dr.Miller.

## 2020-01-25 NOTE — Telephone Encounter (Signed)
Spoke pt. Pt states having burning, itching and frequency x 1 week. Took AZO and it " cleared up" and today has come back and is miserable. Pt request to be seen today if possible. Pt scheduled for OV at 1pm today with Dr Sabra Heck. Pt agreeable. CPS neg.   Routing to Dr Sabra Heck for review and will close encounter.

## 2020-01-25 NOTE — Progress Notes (Signed)
GYNECOLOGY  VISIT  CC:   Patient states that last Monday, she felt as if she was having bladder spasms, burning, itching, frequency, urgency, cold chills, left lower back pain, lower abdominal pain.  HPI: 69 y.o. G0P0 Married White or Caucasian female here for possible UTI.  H/o urinary urgency, burning, increased urinary frequency that started around 01/16/2020.  Her sister suggested taking Azo and this helped.  She has been drinking increased water and cranberry juice as well.  Symptoms seemed to resolve until yesterday when symptoms seemed to come back but worse.  She is having some lower back pain and lower mid abdominal pain.  She is having a sensation of bladder spasm.  She's had chills but no fever.   GYNECOLOGIC HISTORY: Patient's last menstrual period was 12/14/1985. Contraception: Postmenopausal Menopausal hormone therapy: none  Patient Active Problem List   Diagnosis Date Noted  . Migraine 11/19/2017  . Osteoporosis 01/03/2017  . Thrombocytopenia ([place]) 11/26/2011    Past Medical History:  Diagnosis Date  . Anxiety   . Arthritis   . Bursitis   . Chronic constipation    takes magnesium and probiotic  . Chronic migraine    neurology-- barbara beck PA ([facility])  . DDD (degenerative disc disease), lumbar 2011   Moderate  . Environmental and seasonal allergies   . Hematoma of leg    due to trauma in her 64s  . Hepatic cyst 2011   Multiple hepatic cysts, largest measuring 5 cm  . Lumbar spondylosis   . Osteoporosis   . Thrombocytopenia ([place])    chronic  (previously followed by dr Marin Olp, released 2015)  . Wears glasses     Past Surgical History:  Procedure Laterality Date  . ANAL FISSURE REPAIR    . BLEPHAROPLASTY Bilateral   . BUNIONECTOMY Bilateral   . COLONOSCOPY  last one 2011  . DILATATION & CURETTAGE/HYSTEROSCOPY WITH MYOSURE N/A 05/05/2019   Procedure: DILATATION & CURETTAGE/HYSTEROSCOPY WITH MYOSURE RESECTION OF ENDOMETRIAL LESION;  Surgeon:  Megan Salon, MD;  Location: [place];  Service: Gynecology;  Laterality: N/A;  endometrial polyp  . HARDWARE REMOVAL  04/14/2012   Procedure: HARDWARE REMOVAL;  Surgeon: Linna Hoff, MD;  Location: [place];  Service: Orthopedics;  Laterality: Left;  left wrist deep hardware excision  . ORIF WRIST FRACTURE Left 2011  . THUMB SURGERY Right 04/17/2015    MEDS:   Current Outpatient Medications on File Prior to Visit  Medication Sig Dispense Refill  . alendronate (FOSAMAX) 70 MG tablet Take 1 tablet (70 mg total) by mouth once a week. Take with a full glass of water on an empty stomach. 12 tablet 4  . BESIVANCE 0.6 % SUSP Place 1 drop into the right eye 3 (three) times daily.    Marland Kitchen BIOTIN PO Take by mouth.    . Cholecalciferol (VITAMIN D3) 1000 UNITS CAPS Take by mouth daily.    . citalopram (CELEXA) 20 MG tablet Take by mouth every morning.     . clonazePAM (KLONOPIN) 1 MG tablet Take 1 tablet (1 mg total) by mouth daily. (Patient taking differently: Take 1 mg by mouth every evening. ) 30 tablet 1  . cyanocobalamin (CVS VITAMIN B12) 1000 MCG tablet Take by mouth.    . DUREZOL 0.05 % EMUL Place 1 drop into the right eye 3 (three) times daily.    . Fremanezumab-vfrm (AJOVY) 225 MG/1.5ML SOSY Inject into the skin.    Marland Kitchen HYDROcodone-acetaminophen (NORCO/VICODIN) 5-325  MG tablet Take 1-2 tablets by mouth every 6 (six) hours as needed for moderate pain. 12 tablet 0  . ibuprofen (ADVIL) 800 MG tablet Take 1 tablet (800 mg total) by mouth every 8 (eight) hours as needed. 30 tablet 0  . Magnesium 500 MG TABS Take by mouth daily.    . Melatonin 10 MG TABS Take by mouth at bedtime.    . Probiotic Product (PROBIOTIC DAILY PO) Take by mouth.    Marland Kitchen PROLENSA 0.07 % SOLN Place 1 drop into the right eye at bedtime.    . promethazine (PHENERGAN) 12.5 MG tablet Take 12.5 mg by mouth every 6 (six) hours as needed.      . pyridoxine (B-6) 100 MG tablet Take by mouth.    .  SUMAtriptan (IMITREX) 100 MG tablet TAKE 1 TABLET BY MOUTH FOR MIGRAINE.MAY REPEAT IN 2 HRS. NO MORE THAN 2TABS IN 24HRS OR 2-3DAYS/WK  1  . ipratropium (ATROVENT) 0.06 % nasal spray as needed.      No current facility-administered medications on file prior to visit.    ALLERGIES: Corticosteroids, Levonorgestrel-ethinyl estrad, Cortisone, Other, and Latex  Family History  Problem Relation Age of Onset  . Diabetes Mother   . Hypertension Mother   . Stroke Mother   . Dementia Mother   . Heart disease Father   . Cancer Father        lung cancer    SH:  Married, non smoker  Review of Systems  Genitourinary: Positive for frequency and urgency.       Burning with urination Itching Lower back pain Lower abdominal pain  All other systems reviewed and are negative.   PHYSICAL EXAMINATION:    BP 102/60 (BP Location: Right Arm, Patient Position: Sitting, Cuff Size: Normal)   Pulse 64   Temp (!) 96.6 F (35.9 C) (Temporal)   Resp 10   Wt 110 lb (49.9 kg)   LMP 12/14/1985   BMI 20.96 kg/m     General appearance: alert, cooperative and appears stated age Flank:  No CVA tenderness Lymph:  no inguinal LAD noted  Pelvic: External genitalia:  no lesions              Urethra:  normal appearing urethra with no masses, tenderness or lesions              Bartholins and Skenes: normal                 Vagina: normal appearing vagina with normal color and discharge, no lesions              Cervix: no lesions  Chaperone, Terence Lux, CMA, was present for exam.  Assessment: Probable cystitis with dysuria, urgency, frequency and chills  Plan: Urine culture pending Bactrim DS BID x 7 days (due to having chills)

## 2020-01-26 LAB — URINE CULTURE

## 2020-01-29 DIAGNOSIS — H2511 Age-related nuclear cataract, right eye: Secondary | ICD-10-CM | POA: Diagnosis not present

## 2020-01-29 DIAGNOSIS — H2513 Age-related nuclear cataract, bilateral: Secondary | ICD-10-CM | POA: Diagnosis not present

## 2020-01-30 ENCOUNTER — Telehealth: Payer: Self-pay | Admitting: *Deleted

## 2020-01-30 ENCOUNTER — Other Ambulatory Visit: Payer: Self-pay | Admitting: Obstetrics & Gynecology

## 2020-01-30 DIAGNOSIS — H2512 Age-related nuclear cataract, left eye: Secondary | ICD-10-CM | POA: Diagnosis not present

## 2020-01-30 DIAGNOSIS — R82998 Other abnormal findings in urine: Secondary | ICD-10-CM

## 2020-01-30 NOTE — Telephone Encounter (Signed)
Message left to return call to [place] or Dehlia Kilner at [phone].    

## 2020-01-30 NOTE — Telephone Encounter (Signed)
Once finished with antibiotics, please have her come back for repeat urine culture and micro.  Orders placed.  Thanks.

## 2020-01-30 NOTE — Telephone Encounter (Signed)
-----   Message from Megan Salon, MD sent at 01/29/2020 12:58 AM EST ----- Please call pt and get an up date please.  She was seen on Thursday with significant dysuria and a lot of white cells in her urine.  Urine culture was negative, however.  I still think it is a good idea to finish all of the antibiotics.  Thanks.

## 2020-01-30 NOTE — Telephone Encounter (Signed)
Patient is returning call to [facility] or [facility].

## 2020-01-30 NOTE — Telephone Encounter (Signed)
Returned call to patient. Patient states that she is feeling okay. Itching has resolved and "bad spasms with cold chills have gone away." Patient states she is however still experiencing a "tickle" with irritation up inside and feels like the spasms could start again. States she will complete antibiotic tomorrow. RN advised would update Dr. Sabra Heck and return call with recommendations. Patient agreeable.   Routing to provider for review.

## 2020-01-31 NOTE — Telephone Encounter (Signed)
Patient returned call. Message given to patient as seen below from Dr. Sabra Heck and patient verbalized understanding. Patient will finish antibiotic today. Nurse visit scheduled for 02-06-20 at 1015, patient declined earlier appointments. Patient agreeable to date and time of appointment. Future orders present for UC and micro.   Encounter closed.

## 2020-01-31 NOTE — Telephone Encounter (Signed)
Message left to return call to Lissy Deuser at [phone].    

## 2020-02-06 ENCOUNTER — Ambulatory Visit (INDEPENDENT_AMBULATORY_CARE_PROVIDER_SITE_OTHER): Payer: Medicare HMO

## 2020-02-06 ENCOUNTER — Other Ambulatory Visit: Payer: Self-pay

## 2020-02-06 DIAGNOSIS — R82998 Other abnormal findings in urine: Secondary | ICD-10-CM | POA: Diagnosis not present

## 2020-02-06 NOTE — Progress Notes (Signed)
Patient in office for a clean-catch urine culture and micro. Patient states that she is feeling better and has completed the course of antibiotics. Urine tubed for culture and micro and sent to the lab. Routing to provider and closing encounter.

## 2020-02-07 LAB — URINALYSIS, MICROSCOPIC ONLY
Bacteria, UA: NONE SEEN
Casts: NONE SEEN /lpf

## 2020-02-08 LAB — URINE CULTURE

## 2020-02-19 DIAGNOSIS — H2512 Age-related nuclear cataract, left eye: Secondary | ICD-10-CM | POA: Diagnosis not present

## 2020-02-19 DIAGNOSIS — H2513 Age-related nuclear cataract, bilateral: Secondary | ICD-10-CM | POA: Diagnosis not present

## 2020-02-26 DIAGNOSIS — H2513 Age-related nuclear cataract, bilateral: Secondary | ICD-10-CM | POA: Diagnosis not present

## 2020-03-06 DIAGNOSIS — G4700 Insomnia, unspecified: Secondary | ICD-10-CM | POA: Diagnosis not present

## 2020-03-06 DIAGNOSIS — R69 Illness, unspecified: Secondary | ICD-10-CM | POA: Diagnosis not present

## 2020-03-06 DIAGNOSIS — M810 Age-related osteoporosis without current pathological fracture: Secondary | ICD-10-CM | POA: Diagnosis not present

## 2020-03-06 DIAGNOSIS — Z136 Encounter for screening for cardiovascular disorders: Secondary | ICD-10-CM | POA: Diagnosis not present

## 2020-03-06 DIAGNOSIS — Z0000 Encounter for general adult medical examination without abnormal findings: Secondary | ICD-10-CM | POA: Diagnosis not present

## 2020-03-06 DIAGNOSIS — Z13820 Encounter for screening for osteoporosis: Secondary | ICD-10-CM | POA: Diagnosis not present

## 2020-03-06 DIAGNOSIS — Z131 Encounter for screening for diabetes mellitus: Secondary | ICD-10-CM | POA: Diagnosis not present

## 2020-03-06 DIAGNOSIS — G43009 Migraine without aura, not intractable, without status migrainosus: Secondary | ICD-10-CM | POA: Diagnosis not present

## 2020-03-12 ENCOUNTER — Other Ambulatory Visit: Payer: Self-pay | Admitting: Obstetrics & Gynecology

## 2020-03-12 NOTE — Telephone Encounter (Signed)
Medication refill request: Fosamax Last AEX: 02/02/2019 MM Next AEX: 06/18/2020 Last MMG (if hormonal medication request): n/a Refill authorized: #[id number], [id number]   Please advise and refill if appropriate.

## 2020-04-23 DIAGNOSIS — G43709 Chronic migraine without aura, not intractable, without status migrainosus: Secondary | ICD-10-CM | POA: Diagnosis not present

## 2020-04-23 DIAGNOSIS — G43009 Migraine without aura, not intractable, without status migrainosus: Secondary | ICD-10-CM | POA: Diagnosis not present

## 2020-06-10 DIAGNOSIS — M62830 Muscle spasm of back: Secondary | ICD-10-CM | POA: Diagnosis not present

## 2020-06-11 NOTE — Progress Notes (Signed)
69 y.o. G0P0 Married White or Caucasian female here for annual exam.  Having some LLQ pain.  Resolved after hysteroscopy 04/2019.  This has returned and feels like a pinch.  Had colonoscopy 07/2019 with Dr. Collene Mares.  This was normal.  Follow up colonoscopy not recommended.  Denies vaginal bleeding.    Did not have Covid vaccination due to h/o allergy to prior vaccination.  Ajovy has really helped her headaches.    PCP:  [name] in [place].  Will have blood work done with next visit.    Patient's last menstrual period was 12/14/1985.          Sexually active: Yes.    The current method of family planning is post menopausal status.    Exercising: Yes.    walking & gym Smoker:  no  Health Maintenance: Pap:  05-07-15 neg,  11-19-17 neg History of abnormal Pap:  no MMG:  05-19-2019 category b density birads 1:neg.  Reports she will schedule.  Colonoscopy:  2020 BMD:   05-19-2019 f/u 32yrs TDaP:  2018 Pneumonia vaccine(s):  2017 Shingrix:   Not done Hep C testing: patient believes it was tested Screening Labs: not obtained today   reports that she quit smoking about 34 years ago. Her smoking use included cigarettes. She started smoking about 40 years ago. She has a 3.50 pack-year smoking history. She has never used smokeless tobacco. She reports that she does not drink alcohol and does not use drugs.  Past Medical History:  Diagnosis Date  . Anxiety   . Arthritis   . Bursitis   . Chronic constipation    takes magnesium and probiotic  . Chronic migraine    neurology-- barbara beck PA ([facility])  . DDD (degenerative disc disease), lumbar 2011   Moderate  . Environmental and seasonal allergies   . Hematoma of leg    due to trauma in her 40s  . Hepatic cyst 2011   Multiple hepatic cysts, largest measuring 5 cm  . Lumbar spondylosis   . Osteoporosis   . Thrombocytopenia ([place])    chronic  (previously followed by dr Marin Olp, released 2015)  . Wears glasses     Past Surgical  History:  Procedure Laterality Date  . ANAL FISSURE REPAIR    . BLEPHAROPLASTY Bilateral   . BUNIONECTOMY Bilateral   . CATARACT EXTRACTION, BILATERAL     feb & march 2021  . COLONOSCOPY  last one 2011  . DILATATION & CURETTAGE/HYSTEROSCOPY WITH MYOSURE N/A 05/05/2019   Procedure: DILATATION & CURETTAGE/HYSTEROSCOPY WITH MYOSURE RESECTION OF ENDOMETRIAL LESION;  Surgeon: Megan Salon, MD;  Location: [facility];  Service: Gynecology;  Laterality: N/A;  endometrial polyp  . HARDWARE REMOVAL  04/14/2012   Procedure: HARDWARE REMOVAL;  Surgeon: Linna Hoff, MD;  Location: [place];  Service: Orthopedics;  Laterality: Left;  left wrist deep hardware excision  . ORIF WRIST FRACTURE Left 2011  . THUMB SURGERY Right 04/17/2015    Current Outpatient Medications  Medication Sig Dispense Refill  . alendronate (FOSAMAX) 70 MG tablet TAKE 1 TABLET (70 MG ) BY MOUTH ONCE A WEEK. TAKE WITH A FULL GLASS OF WATER ON AN EMPTY STOMACH. 12 tablet 1  . BIOTIN PO Take by mouth.    . Cholecalciferol (VITAMIN D3) 1000 UNITS CAPS Take by mouth daily.    . citalopram (CELEXA) 20 MG tablet Take by mouth every morning.     . clonazePAM (KLONOPIN) 1 MG tablet Take  1 tablet (1 mg total) by mouth daily. (Patient taking differently: Take 1 mg by mouth every evening. ) 30 tablet 1  . cyanocobalamin (CVS VITAMIN B12) 1000 MCG tablet Take by mouth.    . Fremanezumab-vfrm (AJOVY) 225 MG/1.5ML SOSY Inject into the skin.    Marland Kitchen ibuprofen (ADVIL) 800 MG tablet Take 1 tablet (800 mg total) by mouth every 8 (eight) hours as needed. 30 tablet 0  . Magnesium 500 MG TABS Take by mouth daily.    . Melatonin 10 MG TABS Take by mouth at bedtime.    . Probiotic Product (PROBIOTIC DAILY PO) Take by mouth.    . promethazine (PHENERGAN) 12.5 MG tablet Take 12.5 mg by mouth every 6 (six) hours as needed.      . pyridoxine (B-6) 100 MG tablet Take by mouth.    . SUMAtriptan (IMITREX) 100 MG tablet TAKE 1  TABLET BY MOUTH FOR MIGRAINE.MAY REPEAT IN 2 HRS. NO MORE THAN 2TABS IN 24HRS OR 2-3DAYS/WK  1  . tiZANidine (ZANAFLEX) 4 MG capsule Take by mouth. (Patient not taking: Reported on 06/18/2020)     No current facility-administered medications for this visit.    Family History  Problem Relation Age of Onset  . Diabetes Mother   . Hypertension Mother   . Stroke Mother   . Dementia Mother   . Heart disease Father   . Cancer Father        lung cancer    Review of Systems  Constitutional: Negative.   HENT: Negative.   Eyes: Negative.   Respiratory: Negative.   Cardiovascular: Negative.   Gastrointestinal: Negative.   Endocrine: Negative.   Genitourinary: Negative.   Musculoskeletal: Negative.   Skin: Negative.   Allergic/Immunologic: Negative.   Neurological: Negative.   Hematological: Negative.   Psychiatric/Behavioral: Negative.     Exam:   BP 90/60   Pulse 64   Resp 16   Ht 5' 0.5" (1.537 m)   Wt 110 lb (49.9 kg)   LMP 12/14/1985   BMI 21.13 kg/m   Height: 5' 0.5" (153.7 cm)  General appearance: alert, cooperative and appears stated age Head: Normocephalic, without obvious abnormality, atraumatic Neck: no adenopathy, supple, symmetrical, trachea midline and thyroid normal to inspection and palpation Lungs: clear to auscultation bilaterally Breasts: normal appearance, no masses or tenderness Heart: regular rate and rhythm Abdomen: soft, non-tender; bowel sounds normal; no masses,  no organomegaly Extremities: extremities normal, atraumatic, no cyanosis or edema Skin: Skin color, texture, turgor normal. No rashes or lesions Lymph nodes: Cervical, supraclavicular, and axillary nodes normal. No abnormal inguinal nodes palpated Neurologic: Grossly normal   Pelvic: External genitalia:  no lesions              Urethra:  normal appearing urethra with no masses, tenderness or lesions              Bartholins and Skenes: normal                 Vagina: normal appearing  vagina with normal color and discharge, no lesions              Cervix: no lesions              Pap taken: No. Bimanual Exam:  Uterus:  normal size, contour, position, consistency, mobility, non-tender              Adnexa: normal adnexa and no mass, fullness, tenderness  Rectovaginal: Confirms               Anus:  normal sphincter tone, no lesions  Chaperone, Royal Hawthorn, CMA, was present for exam.  A:  Well Woman with normal exam PMP, no HRT H/o thrombocytopenia and leukopenia, followed by Dr. Marin Olp, released 2015 Migraines LLQ pain Osteopenia  P:   Mammogram guidelines reviewed.  She will call and schedule her MMG. pap smear not indicated She will return for PUS Rx for Fosamax 70mg  orally weekly.  #12/4RF Plan BMD 2023 Return annually or prn

## 2020-06-18 ENCOUNTER — Encounter: Payer: Self-pay | Admitting: Obstetrics & Gynecology

## 2020-06-18 ENCOUNTER — Telehealth: Payer: Self-pay | Admitting: Obstetrics & Gynecology

## 2020-06-18 ENCOUNTER — Other Ambulatory Visit: Payer: Self-pay

## 2020-06-18 ENCOUNTER — Ambulatory Visit (INDEPENDENT_AMBULATORY_CARE_PROVIDER_SITE_OTHER): Payer: Medicare HMO | Admitting: Obstetrics & Gynecology

## 2020-06-18 VITALS — BP 90/60 | HR 64 | Resp 16 | Ht 60.5 in | Wt 110.0 lb

## 2020-06-18 DIAGNOSIS — Z01419 Encounter for gynecological examination (general) (routine) without abnormal findings: Secondary | ICD-10-CM | POA: Diagnosis not present

## 2020-06-18 DIAGNOSIS — R1032 Left lower quadrant pain: Secondary | ICD-10-CM

## 2020-06-18 MED ORDER — ALENDRONATE SODIUM 70 MG PO TABS: 70.0000 mg | ORAL_TABLET | ORAL | 4 refills | Status: DC

## 2020-06-18 NOTE — Telephone Encounter (Signed)
Spoke with patient regarding benefits for recommended ultrasound. Patient is aware that ultrasound is transvaginal. Patient acknowledges understanding of information presented. Patient is aware of cancellation policy. Patient scheduled appointment for 06/27/2020 at 0300PM with M. Edwinna Areola, MD. Encounter closed.

## 2020-06-27 ENCOUNTER — Ambulatory Visit (INDEPENDENT_AMBULATORY_CARE_PROVIDER_SITE_OTHER): Payer: Medicare HMO

## 2020-06-27 ENCOUNTER — Ambulatory Visit: Payer: Medicare HMO | Admitting: Obstetrics & Gynecology

## 2020-06-27 ENCOUNTER — Encounter: Payer: Self-pay | Admitting: Obstetrics & Gynecology

## 2020-06-27 ENCOUNTER — Other Ambulatory Visit: Payer: Self-pay

## 2020-06-27 VITALS — BP 110/62 | HR 68 | Resp 16 | Wt 110.0 lb

## 2020-06-27 DIAGNOSIS — R1032 Left lower quadrant pain: Secondary | ICD-10-CM | POA: Diagnosis not present

## 2020-06-27 DIAGNOSIS — G8929 Other chronic pain: Secondary | ICD-10-CM | POA: Diagnosis not present

## 2020-06-27 DIAGNOSIS — R1031 Right lower quadrant pain: Secondary | ICD-10-CM

## 2020-06-27 NOTE — Progress Notes (Signed)
69 y.o. G0P0 Married White or Caucasian female here for pelvic ultrasound due to RLQ pain that has been present for about two years.  Pt describes this as a pinching sensation.  She can point to where the pain is localized.  She had a hysteroscopy with polyp removal in 2020 and pain resolved for a short while.  She is here for PUS to assess ovaries and to see if there is recurrence of a polyp.  Denies vaginal bleeding.  Last colonoscopy 07/2019.  Patient's last menstrual period was 12/14/1985.  Contraception: PMP  Findings:  UTERUS: 5.2 x 2.7 x 1.5cm, small amount of fluid in endometrial cavity EMS: 1.69mm ADNEXA: Left ovary:  1.6 x 0.9 x 0.7cm       Right ovary: 2.3 x 0.7 x 0.8cm.  boht ovaries are atrophic in appearance. CUL DE SAC: no free fluid  Discussion:  Ultrasonographer supervised.  Pt and I reviewed images and results.  At this point, my only suggestion for additional evaluation is to proceed with CT of abdomen/pelvis.  Am doubtful this will show any abnormality but can proceed due to length of persistence of pain.  Pt would like this for additional reassurance and evaluation.  Will proceed with getting scheduled/precert if needed.  Assessment:  persistent RLQ of about two years duration  Plan:  BMP will be obtained for renal function.  Proceed with CT of abdomen/pelvis  About 20 minutes spent with pt after review of ultrasound findings discussing options for additional evaluation.

## 2020-06-28 ENCOUNTER — Telehealth: Payer: Self-pay | Admitting: *Deleted

## 2020-06-28 DIAGNOSIS — G8929 Other chronic pain: Secondary | ICD-10-CM

## 2020-06-28 DIAGNOSIS — R197 Diarrhea, unspecified: Secondary | ICD-10-CM

## 2020-06-28 DIAGNOSIS — K529 Noninfective gastroenteritis and colitis, unspecified: Secondary | ICD-10-CM

## 2020-06-28 LAB — BASIC METABOLIC PANEL
BUN/Creatinine Ratio: 12 (ref 12–28)
BUN: 11 mg/dL (ref 8–27)
CO2: 28 mmol/L (ref 20–29)
Calcium: 9.2 mg/dL (ref 8.7–10.3)
Chloride: 102 mmol/L (ref 96–106)
Creatinine, Ser: 0.89 mg/dL (ref 0.57–1.00)
GFR calc Af Amer: 75 mL/min/{1.73_m2} (ref >59)
GFR calc non Af Amer: 65 mL/min/{1.73_m2} (ref >59)
Glucose: 83 mg/dL (ref 65–99)
Potassium: 4.3 mmol/L (ref 3.5–5.2)
Sodium: 139 mmol/L (ref 134–144)

## 2020-06-28 NOTE — Telephone Encounter (Signed)
Reviewed with Dr. Sabra Heck -confirmed RLQ pain.   Order placed for CT abd/pelvis w/ contrast at [facility].  Placed in [facility] hold.   Call placed to [facility], spoke with [facility]. Patient scheduled for CT on 07/15/20 at 12:50pm, arrive at 12:30pm. [address] location.  Patient will need to pick up contrast from either [facility] prior to day of IMG.  Nothing to eat 4 hours prior to testing. Liquids and medication ok.   Call to patient to advise of appt. Patient states she is currently playing bridge, she will return call later today or Monday to further discuss.   Routing to [employer]  and [employer] for [employer].

## 2020-06-28 NOTE — Telephone Encounter (Signed)
-----   Message from Megan Salon, MD sent at 06/27/2020  4:22 PM EDT ----- Regarding: CT abd/pel Sharee Pimple, Could you please work on scheduling a CT abd/pelvis due to LLQ pain.  Pt has chronic diarrhea, had negatitve colonoscopy in 2020 and normal ovaries on ultrasound today.  This has been going on about two years (not sure that helps or doesn't).  Thanks.  Vinnie Level

## 2020-07-02 NOTE — Telephone Encounter (Signed)
Spoke with patient. Advised of appt as seen below. Patient states she is driving right now and date scheduled will not work for her. Patient will return call to the office when she is where she can talk and take notes.

## 2020-07-08 NOTE — Telephone Encounter (Signed)
Spoke with patient. Patient will be out of town on 8/2, she is requesting to r/s CT scan. Number provided to patient for [facility], patient will contact [facility] directly to reschedule. Patient thankful for return call, is aware to call if any additional assistance is needed.

## 2020-07-08 NOTE — Telephone Encounter (Signed)
Per review of Epic, CT scan r/s to 07/23/20 at 11am.   Continue IMG hold.   Routing to Dr. Lestine Box.   Encounter closed.

## 2020-07-08 NOTE — Telephone Encounter (Signed)
Patient returning call regarding CT scan. °

## 2020-07-15 ENCOUNTER — Other Ambulatory Visit: Payer: Medicare HMO

## 2020-07-23 ENCOUNTER — Ambulatory Visit
Admission: RE | Admit: 2020-07-23 | Discharge: 2020-07-23 | Disposition: A | Discharge status: Home / Self Care | Payer: Medicare HMO | Source: Ambulatory Visit | Attending: Obstetrics & Gynecology | Admitting: Obstetrics & Gynecology

## 2020-07-23 ENCOUNTER — Other Ambulatory Visit: Payer: Self-pay

## 2020-07-23 DIAGNOSIS — K529 Noninfective gastroenteritis and colitis, unspecified: Secondary | ICD-10-CM

## 2020-07-23 DIAGNOSIS — K862 Cyst of pancreas: Secondary | ICD-10-CM | POA: Diagnosis not present

## 2020-07-23 DIAGNOSIS — G8929 Other chronic pain: Secondary | ICD-10-CM

## 2020-07-23 DIAGNOSIS — R1031 Right lower quadrant pain: Secondary | ICD-10-CM

## 2020-07-23 DIAGNOSIS — K8689 Other specified diseases of pancreas: Secondary | ICD-10-CM | POA: Diagnosis not present

## 2020-07-23 DIAGNOSIS — K7689 Other specified diseases of liver: Secondary | ICD-10-CM | POA: Diagnosis not present

## 2020-07-23 DIAGNOSIS — R197 Diarrhea, unspecified: Secondary | ICD-10-CM | POA: Diagnosis not present

## 2020-07-23 MED ORDER — IOPAMIDOL (ISOVUE-300) INJECTION 61%
100.0000 mL | Freq: Once | INTRAVENOUS | Status: AC | PRN
  Administered 2020-07-23: 100 mL via INTRAVENOUS

## 2020-07-24 ENCOUNTER — Telehealth: Payer: Self-pay | Admitting: *Deleted

## 2020-07-24 DIAGNOSIS — D490 Neoplasm of unspecified behavior of digestive system: Secondary | ICD-10-CM

## 2020-07-24 NOTE — Telephone Encounter (Signed)
Burnice Logan, RN  07/24/2020 9:07 AM EDT Back to Top    Left message to call Sharee Pimple, RN at [facility].   Removed from IMG hold.

## 2020-07-24 NOTE — Telephone Encounter (Signed)
-----   Message from Salvadore Dom, MD sent at 07/23/2020  5:14 PM EDT ----- Please let the patient know that there are no acute findings within the abdomen or pelvis.  She was noted to have 2 very small pancreatic cysts. Recommendation is for f/u imaging in 2 years with MRI. Let her know that Dr Sabra Heck is on vacation but will review the report when she returns.

## 2020-07-29 NOTE — Telephone Encounter (Signed)
Spoke with patient, advised per Dr. Talbert Nan. Advised patient our office wll f/u if any additional recommendations from Dr. Sabra Heck once reviewed. Patient verbalizes understanding and is agreeable.   Routing to Dr. Sabra Heck for review.

## 2020-07-31 NOTE — Telephone Encounter (Signed)
Could you please let pt know that I've reviewed her CT scans.  The findings in the pancreas do appear benign.  They will need to be followed.  However, I would really like her to have a consultation with Dr. Ronnette Hila at [facility].  He is an abdominal cancer surgery specialist.  I do not think she has cancer but I would like her to see him in case there are any changes and surgery becomes something that is recommended.  She will already have established care and know who will care for her.  Ok to place referral if pt is ok with this.  DX:  Pancreatic IPMNs.  Thanks.

## 2020-08-01 NOTE — Telephone Encounter (Signed)
Spoke with patient, advised as seen below per Dr. Sabra Heck. Patient agreeable to referral.   Advised patient will place referral, our office referral coordinator with f/u with appt details once scheduled. Patient request to schedule on a Monday or Tuesday. Anytime after September, not the last week of October. Patient verbalizes understanding.   Order placed.   Routing to [employer] .   Encounter closed.

## 2020-09-06 DIAGNOSIS — K869 Disease of pancreas, unspecified: Secondary | ICD-10-CM | POA: Diagnosis not present

## 2020-10-21 DIAGNOSIS — R69 Illness, unspecified: Secondary | ICD-10-CM | POA: Diagnosis not present

## 2020-10-21 DIAGNOSIS — Z23 Encounter for immunization: Secondary | ICD-10-CM | POA: Diagnosis not present

## 2020-10-21 DIAGNOSIS — G4700 Insomnia, unspecified: Secondary | ICD-10-CM | POA: Diagnosis not present

## 2020-12-26 DIAGNOSIS — L821 Other seborrheic keratosis: Secondary | ICD-10-CM | POA: Diagnosis not present

## 2020-12-26 DIAGNOSIS — L218 Other seborrheic dermatitis: Secondary | ICD-10-CM | POA: Diagnosis not present

## 2020-12-26 DIAGNOSIS — L720 Epidermal cyst: Secondary | ICD-10-CM | POA: Diagnosis not present

## 2021-04-17 DIAGNOSIS — G43709 Chronic migraine without aura, not intractable, without status migrainosus: Secondary | ICD-10-CM | POA: Diagnosis not present

## 2021-05-08 DIAGNOSIS — Z0000 Encounter for general adult medical examination without abnormal findings: Secondary | ICD-10-CM | POA: Diagnosis not present

## 2021-05-08 DIAGNOSIS — R69 Illness, unspecified: Secondary | ICD-10-CM | POA: Diagnosis not present

## 2021-05-08 DIAGNOSIS — Z131 Encounter for screening for diabetes mellitus: Secondary | ICD-10-CM | POA: Diagnosis not present

## 2021-05-08 DIAGNOSIS — G4700 Insomnia, unspecified: Secondary | ICD-10-CM | POA: Diagnosis not present

## 2021-05-08 DIAGNOSIS — F419 Anxiety disorder, unspecified: Secondary | ICD-10-CM | POA: Diagnosis not present

## 2021-05-08 DIAGNOSIS — J309 Allergic rhinitis, unspecified: Secondary | ICD-10-CM | POA: Diagnosis not present

## 2021-05-08 DIAGNOSIS — Z136 Encounter for screening for cardiovascular disorders: Secondary | ICD-10-CM | POA: Diagnosis not present

## 2021-05-08 DIAGNOSIS — G43709 Chronic migraine without aura, not intractable, without status migrainosus: Secondary | ICD-10-CM | POA: Diagnosis not present

## 2021-07-31 DIAGNOSIS — M25531 Pain in right wrist: Secondary | ICD-10-CM | POA: Diagnosis not present

## 2021-07-31 DIAGNOSIS — M7711 Lateral epicondylitis, right elbow: Secondary | ICD-10-CM | POA: Diagnosis not present

## 2021-08-04 ENCOUNTER — Ambulatory Visit: Payer: Medicare HMO

## 2021-08-14 ENCOUNTER — Other Ambulatory Visit: Payer: Self-pay | Admitting: Obstetrics & Gynecology

## 2021-08-19 NOTE — Telephone Encounter (Signed)
LMOVM that pt needs appt scheduled

## 2021-08-21 DIAGNOSIS — M542 Cervicalgia: Secondary | ICD-10-CM | POA: Diagnosis not present

## 2021-08-21 DIAGNOSIS — Z79899 Other long term (current) drug therapy: Secondary | ICD-10-CM | POA: Diagnosis not present

## 2021-08-21 DIAGNOSIS — G43709 Chronic migraine without aura, not intractable, without status migrainosus: Secondary | ICD-10-CM | POA: Diagnosis not present

## 2021-09-08 DIAGNOSIS — K7689 Other specified diseases of liver: Secondary | ICD-10-CM | POA: Diagnosis not present

## 2021-09-08 DIAGNOSIS — K869 Disease of pancreas, unspecified: Secondary | ICD-10-CM | POA: Diagnosis not present

## 2021-09-08 DIAGNOSIS — K862 Cyst of pancreas: Secondary | ICD-10-CM | POA: Diagnosis not present

## 2021-09-08 DIAGNOSIS — R978 Other abnormal tumor markers: Secondary | ICD-10-CM | POA: Diagnosis not present

## 2021-09-08 DIAGNOSIS — R799 Abnormal finding of blood chemistry, unspecified: Secondary | ICD-10-CM | POA: Diagnosis not present

## 2021-09-08 DIAGNOSIS — Z23 Encounter for immunization: Secondary | ICD-10-CM | POA: Diagnosis not present

## 2021-09-30 ENCOUNTER — Emergency Department (HOSPITAL_BASED_OUTPATIENT_CLINIC_OR_DEPARTMENT_OTHER): Payer: Medicare HMO

## 2021-09-30 ENCOUNTER — Emergency Department (HOSPITAL_BASED_OUTPATIENT_CLINIC_OR_DEPARTMENT_OTHER)
Admission: EM | Admit: 2021-09-30 | Discharge: 2021-09-30 | Disposition: A | Discharge status: Home / Self Care | Payer: Medicare HMO | Attending: Emergency Medicine | Admitting: Emergency Medicine

## 2021-09-30 ENCOUNTER — Encounter (HOSPITAL_BASED_OUTPATIENT_CLINIC_OR_DEPARTMENT_OTHER): Payer: Self-pay | Admitting: *Deleted

## 2021-09-30 ENCOUNTER — Other Ambulatory Visit: Payer: Self-pay

## 2021-09-30 DIAGNOSIS — M20011 Mallet finger of right finger(s): Secondary | ICD-10-CM | POA: Insufficient documentation

## 2021-09-30 DIAGNOSIS — Y93E5 Activity, floor mopping and cleaning: Secondary | ICD-10-CM | POA: Insufficient documentation

## 2021-09-30 DIAGNOSIS — Z87891 Personal history of nicotine dependence: Secondary | ICD-10-CM | POA: Insufficient documentation

## 2021-09-30 DIAGNOSIS — X501XXA Overexertion from prolonged static or awkward postures, initial encounter: Secondary | ICD-10-CM | POA: Diagnosis not present

## 2021-09-30 DIAGNOSIS — Z9889 Other specified postprocedural states: Secondary | ICD-10-CM | POA: Diagnosis not present

## 2021-09-30 DIAGNOSIS — S6991XA Unspecified injury of right wrist, hand and finger(s), initial encounter: Secondary | ICD-10-CM | POA: Diagnosis not present

## 2021-09-30 DIAGNOSIS — M7989 Other specified soft tissue disorders: Secondary | ICD-10-CM | POA: Diagnosis not present

## 2021-09-30 DIAGNOSIS — Z91040 Latex allergy status: Secondary | ICD-10-CM | POA: Diagnosis not present

## 2021-09-30 NOTE — Discharge Instructions (Signed)
Your history, exam, and imaging today are consistent with a finger injury that I am concerned is a mallet finger injury.  I spoke with the orthopedics team with Dr. Apolonio Schneiders and they requested flat splinting and follow-up in clinic later this week or early next week.  Please be careful not to reinjure it and keep it in a splint.  If any symptoms change or worsen, please return to the nearest emergency department.

## 2021-09-30 NOTE — ED Notes (Signed)
Pt given a ice pack

## 2021-09-30 NOTE — ED Provider Notes (Signed)
[facility] Provider Note   CSN: [id number] Arrival date & time: 09/30/21  1930     History Chief Complaint  Patient presents with   Finger Injury    [name] is a 70 y.o. female.  The history is provided by the patient and medical records. No language interpreter was used.  Hand Pain This is a new problem. The current episode started 1 to 2 hours ago. The problem occurs constantly. The problem has not changed since onset.Pertinent negatives include no chest pain, no abdominal pain, no headaches and no shortness of breath. The symptoms are aggravated by twisting and bending. Nothing relieves the symptoms. She has tried nothing for the symptoms. The treatment provided no relief.      Past Medical History:  Diagnosis Date   Anxiety    Arthritis    Bursitis    Chronic constipation    takes magnesium and probiotic   Chronic migraine    neurology-- barbara beck PA ([facility])   DDD (degenerative disc disease), lumbar 2011   Moderate   Environmental and seasonal allergies    Hematoma of leg    due to trauma in her 65s   Hepatic cyst 2011   Multiple hepatic cysts, largest measuring 5 cm   Lumbar spondylosis    Osteoporosis    Thrombocytopenia (HCC)    chronic  (previously followed by dr Marin Olp, released 2015)   Wears glasses     Patient Active Problem List   Diagnosis Date Noted   Migraine 11/19/2017   Osteoporosis 01/03/2017   Thrombocytopenia ([place]) 11/26/2011    Past Surgical History:  Procedure Laterality Date   ANAL FISSURE REPAIR     BLEPHAROPLASTY Bilateral    BUNIONECTOMY Bilateral    CATARACT EXTRACTION, BILATERAL     feb & march 2021   COLONOSCOPY  last one 2011   DILATATION & CURETTAGE/HYSTEROSCOPY WITH MYOSURE N/A 05/05/2019   Procedure: DILATATION & CURETTAGE/HYSTEROSCOPY WITH MYOSURE RESECTION OF ENDOMETRIAL LESION;  Surgeon: Megan Salon, MD;  Location: [facility];  Service:  Gynecology;  Laterality: N/A;  endometrial polyp   HARDWARE REMOVAL  04/14/2012   Procedure: HARDWARE REMOVAL;  Surgeon: Linna Hoff, MD;  Location: [place];  Service: Orthopedics;  Laterality: Left;  left wrist deep hardware excision   ORIF WRIST FRACTURE Left 2011   THUMB SURGERY Right 04/17/2015     OB History     Gravida  0   Para      Term      Preterm      AB      Living         SAB      IAB      Ectopic      Multiple      Live Births              Family History  Problem Relation Age of Onset   Diabetes Mother    Hypertension Mother    Stroke Mother    Dementia Mother    Heart disease Father    Cancer Father        lung cancer    Social History   Tobacco Use   Smoking status: Former    Packs/day: 0.50    Years: 7.00    Pack years: 3.50    Types: Cigarettes    Start date: 10/21/1979    Quit date: 04/11/1986    Years since  quitting: 35.4   Smokeless tobacco: Never  Vaping Use   Vaping Use: Never used  Substance Use Topics   Alcohol use: No    Alcohol/week: 0.0 standard drinks   Drug use: No    Home Medications Prior to Admission medications   Medication Sig Start Date End Date Taking? Authorizing Provider  alendronate (FOSAMAX) 70 MG tablet TAKE 1 TABLET BY MOUTH ONCE A WEEK. TAKE WITH A FULL GLASS OF WATER ON AN EMPTY STOMACH. 08/20/21   Megan Salon, MD  BIOTIN PO Take by mouth.    [provider]  Cholecalciferol (VITAMIN D3) 1000 UNITS CAPS Take by mouth daily.    [provider]  citalopram (CELEXA) 20 MG tablet Take by mouth every morning.  07/30/17   [provider]  clonazePAM (KLONOPIN) 1 MG tablet Take 1 tablet (1 mg total) by mouth daily. Patient taking differently: Take 1 mg by mouth every evening.  12/29/17   Megan Salon, MD  cyanocobalamin ([employer] VITAMIN B12) 1000 MCG tablet Take by mouth.    [provider]  ibuprofen (ADVIL) 800 MG tablet Take 1 tablet (800 mg total)  by mouth every 8 (eight) hours as needed. 05/05/19   Megan Salon, MD  Magnesium 500 MG TABS Take by mouth daily.    [provider]  Melatonin 10 MG TABS Take by mouth at bedtime.    [provider]  promethazine (PHENERGAN) 12.5 MG tablet Take 12.5 mg by mouth every 6 (six) hours as needed.      [provider]  pyridoxine (B-6) 100 MG tablet Take by mouth.    [provider]  SUMAtriptan (IMITREX) 100 MG tablet TAKE 1 TABLET BY MOUTH FOR MIGRAINE.MAY REPEAT IN 2 HRS. NO MORE THAN 2TABS IN 24HRS OR 2-3DAYS/WK 12/05/16   [provider]  tiZANidine (ZANAFLEX) 4 MG capsule Take by mouth. Patient not taking: Reported on 06/27/2020 06/10/20   [provider]    Allergies    Corticosteroids, Levonorgestrel-ethinyl estrad, Cortisone, Other, and Latex  Review of Systems   Review of Systems  Constitutional:  Negative for chills and fatigue.  HENT:  Negative for congestion.   Respiratory:  Negative for chest tightness and shortness of breath.   Cardiovascular:  Negative for chest pain.  Gastrointestinal:  Negative for abdominal pain, constipation, diarrhea, nausea and vomiting.  Genitourinary:  Negative for flank pain.  Musculoskeletal:  Negative for back pain.  Skin:  Negative for wound.  Neurological:  Negative for headaches.  Psychiatric/Behavioral:  Negative for agitation.   All other systems reviewed and are negative.  Physical Exam Updated Vital Signs BP (!) 149/76   Pulse 63   Temp 97.8 F (36.6 C) (Oral)   Resp 20   Ht 5' 0.5" (1.537 m)   Wt 49.4 kg   LMP 12/14/1985   SpO2 100%   BMI 20.94 kg/m   Physical Exam Vitals and nursing note reviewed.  Constitutional:      General: She is not in acute distress.    Appearance: She is well-developed. She is not ill-appearing.  HENT:     Head: Normocephalic and atraumatic.  Eyes:     Conjunctiva/sclera: Conjunctivae normal.  Cardiovascular:     Rate and Rhythm: Normal rate  and regular rhythm.     Heart sounds: No murmur heard. Pulmonary:     Effort: Pulmonary effort is normal. No respiratory distress.     Breath sounds: Normal breath sounds.  Abdominal:  Palpations: Abdomen is soft.     Tenderness: There is no abdominal tenderness.  Musculoskeletal:        General: Tenderness and signs of injury present.     Cervical back: Neck supple.     Comments: Tenderness in the right middle finger at the DIP joint.  Patient cannot extend at the DIP joint of the right middle finger.  Otherwise normal sensation, strength, and pulses.  Pain with flexion as well.  No snuffbox tenderness or other abnormalities.  Skin:    General: Skin is warm and dry.     Capillary Refill: Capillary refill takes less than 2 seconds.     Findings: No erythema or rash.  Neurological:     General: No focal deficit present.     Mental Status: She is alert.  Psychiatric:        Mood and Affect: Mood normal.       ED Results / Procedures / Treatments   Labs (all labs ordered are listed, but only abnormal results are displayed) Labs Reviewed - No data to display  EKG None  Radiology DG Finger Middle Right  Result Date: 09/30/2021 CLINICAL DATA:  Recent injury while cleaning, initial encounter EXAM: RIGHT MIDDLE FINGER 2+V COMPARISON:  None. FINDINGS: Mild soft tissue swelling is noted about the proximal interphalangeal joint. No definitive fracture is seen. There is well corticated bony density along the dorsal aspect of the third middle phalanx distally likely related to prior trauma and nonunion. Small bony density is noted at the base of the third middle phalanx which may represent a small avulsion. Correlate to point tenderness. Postsurgical changes at the first MCP joint are noted. IMPRESSION: Findings suspicious for avulsion fracture at the base of the third middle phalanx. Electronically Signed   By: Inez Catalina M.D.   On: 09/30/2021 20:09    Procedures Procedures    Medications Ordered in ED Medications - No data to display  ED Course  I have reviewed the triage vital signs and the nursing notes.  Pertinent labs & imaging results that were available during my care of the patient were reviewed by me and considered in my medical decision making (see chart for details).    MDM Rules/Calculators/A&P                           ADIYAH LAME is a 70 y.o. female with a past medical history significant for osteoporosis, migraines, degenerative changes, and previous hand surgeries who presents with finger injury.  According to patient, she was scrubbing the carpet with a rag where her cat had urinated on the floor when she felt a loud pop while pushing her finger forward.  Patient had immediate onset of deformity and pain in her right middle finger.  She is right-handed.  She reports she is unable to fully extend her finger all the way and has pain with any movement.  Otherwise no complaints including no preceding symptoms.  She does note that she has had tendinous ruptures in her hands in the past and has been managed by Dr. Apolonio Schneiders with [facility].  On exam, patient appears to have a mallet type deformity to her right middle finger and otherwise exam is reassuring.  Normal sensation and strength otherwise in the fingers and good cap refill and pulses.  No snuffbox tenderness.  No wrist tenderness.  Lungs clear and chest nontender.  Patient is unable to fully extend distally but does  have also some pain with flexion.  No laceration seen.  No skin changes seen.  X-ray was obtained showing possible avulsion fracture.  Given the patient's extensive hand surgical history, the orthopedics team at [facility] was called who recommended an extension splint on the distal right middle finger and follow-up in clinic with Dr. Apolonio Schneiders later this week versus early next week.  She will keep it in the splint.  I suspect this is a mallet type injury versus a [place] finger  injury.  Patient agrees with outpatient follow-up and plan of care and was discharged in good condition.  She was encouraged to not overuse the finger or reinjure it.    Final Clinical Impression(s) / ED Diagnoses Final diagnoses:  Injury of finger of right hand, initial encounter  Mallet deformity of right middle finger    Rx / DC Orders ED Discharge Orders     None      Clinical Impression: 1. Injury of finger of right hand, initial encounter   2. Mallet deformity of right middle finger     Disposition: Discharge  Condition: Good  I have discussed the results, Dx and Tx plan with the pt(& family if present). He/she/they expressed understanding and agree(s) with the plan. Discharge instructions discussed at great length. Strict return precautions discussed and pt &/or family have verbalized understanding of the instructions. No further questions at time of discharge.    Discharge Medication List as of 09/30/2021  9:25 PM      Follow Up: [place], [place] [id number] [phone]     [place] [address] [id number] DG [facility] [place]       Lorelee Mclaurin, Gwenyth Allegra, MD 10/01/21 [phone]

## 2021-09-30 NOTE — ED Notes (Addendum)
First contact with patient. Patient arrived via triage from home with complaints of left middle finger pain while cleaning carpet. Patient is unable to move middle digit but otherwise has good PSCM to the rest of the hand Good cap refill noted to finger - deformity noted.  Pt is A&OX 4. Respirations even/unlabored. Patient updated on plan of care. Will continue to monitor patient.   2124: Patient splinted by physician. +pscm noted upon discharge home.

## 2021-09-30 NOTE — ED Triage Notes (Signed)
She was cleaning carpet and felt a crack. Her right middle finger is deformed.

## 2021-10-03 DIAGNOSIS — M79641 Pain in right hand: Secondary | ICD-10-CM | POA: Diagnosis not present

## 2021-10-03 DIAGNOSIS — S62632A Displaced fracture of distal phalanx of right middle finger, initial encounter for closed fracture: Secondary | ICD-10-CM | POA: Diagnosis not present

## 2021-10-03 DIAGNOSIS — M79644 Pain in right finger(s): Secondary | ICD-10-CM | POA: Diagnosis not present

## 2021-10-28 DIAGNOSIS — S62632A Displaced fracture of distal phalanx of right middle finger, initial encounter for closed fracture: Secondary | ICD-10-CM | POA: Diagnosis not present

## 2021-10-29 DIAGNOSIS — S62632A Displaced fracture of distal phalanx of right middle finger, initial encounter for closed fracture: Secondary | ICD-10-CM | POA: Diagnosis not present

## 2021-10-29 DIAGNOSIS — X58XXXA Exposure to other specified factors, initial encounter: Secondary | ICD-10-CM | POA: Diagnosis not present

## 2021-10-29 DIAGNOSIS — Y999 Unspecified external cause status: Secondary | ICD-10-CM | POA: Diagnosis not present

## 2021-10-29 DIAGNOSIS — S66312A Strain of extensor muscle, fascia and tendon of right middle finger at wrist and hand level, initial encounter: Secondary | ICD-10-CM | POA: Diagnosis not present

## 2021-10-29 DIAGNOSIS — M20011 Mallet finger of right finger(s): Secondary | ICD-10-CM | POA: Diagnosis not present

## 2021-11-04 DIAGNOSIS — Z4789 Encounter for other orthopedic aftercare: Secondary | ICD-10-CM | POA: Diagnosis not present

## 2021-11-04 DIAGNOSIS — S62632A Displaced fracture of distal phalanx of right middle finger, initial encounter for closed fracture: Secondary | ICD-10-CM | POA: Diagnosis not present

## 2021-11-28 DIAGNOSIS — S62632D Displaced fracture of distal phalanx of right middle finger, subsequent encounter for fracture with routine healing: Secondary | ICD-10-CM | POA: Diagnosis not present

## 2021-11-28 DIAGNOSIS — Z4789 Encounter for other orthopedic aftercare: Secondary | ICD-10-CM | POA: Diagnosis not present

## 2021-12-12 DIAGNOSIS — S62632A Displaced fracture of distal phalanx of right middle finger, initial encounter for closed fracture: Secondary | ICD-10-CM | POA: Diagnosis not present

## 2022-01-01 DIAGNOSIS — S62632A Displaced fracture of distal phalanx of right middle finger, initial encounter for closed fracture: Secondary | ICD-10-CM | POA: Diagnosis not present

## 2022-01-01 DIAGNOSIS — S62632D Displaced fracture of distal phalanx of right middle finger, subsequent encounter for fracture with routine healing: Secondary | ICD-10-CM | POA: Diagnosis not present

## 2022-01-13 DIAGNOSIS — M79644 Pain in right finger(s): Secondary | ICD-10-CM | POA: Diagnosis not present

## 2022-01-20 DIAGNOSIS — F419 Anxiety disorder, unspecified: Secondary | ICD-10-CM | POA: Diagnosis not present

## 2022-01-20 DIAGNOSIS — R69 Illness, unspecified: Secondary | ICD-10-CM | POA: Diagnosis not present

## 2022-01-20 DIAGNOSIS — G4700 Insomnia, unspecified: Secondary | ICD-10-CM | POA: Diagnosis not present

## 2022-01-20 DIAGNOSIS — M79644 Pain in right finger(s): Secondary | ICD-10-CM | POA: Diagnosis not present

## 2022-01-26 DIAGNOSIS — Z79899 Other long term (current) drug therapy: Secondary | ICD-10-CM | POA: Diagnosis not present

## 2022-01-26 DIAGNOSIS — G43709 Chronic migraine without aura, not intractable, without status migrainosus: Secondary | ICD-10-CM | POA: Diagnosis not present

## 2022-01-29 DIAGNOSIS — J069 Acute upper respiratory infection, unspecified: Secondary | ICD-10-CM | POA: Diagnosis not present

## 2022-01-30 DIAGNOSIS — S62632D Displaced fracture of distal phalanx of right middle finger, subsequent encounter for fracture with routine healing: Secondary | ICD-10-CM | POA: Diagnosis not present

## 2022-01-30 DIAGNOSIS — M79644 Pain in right finger(s): Secondary | ICD-10-CM | POA: Diagnosis not present

## 2022-02-02 ENCOUNTER — Ambulatory Visit (HOSPITAL_BASED_OUTPATIENT_CLINIC_OR_DEPARTMENT_OTHER): Payer: Medicare HMO | Admitting: Obstetrics & Gynecology

## 2022-02-05 ENCOUNTER — Other Ambulatory Visit: Payer: Self-pay | Admitting: Obstetrics & Gynecology

## 2022-02-09 ENCOUNTER — Ambulatory Visit (HOSPITAL_BASED_OUTPATIENT_CLINIC_OR_DEPARTMENT_OTHER): Payer: Medicare HMO | Admitting: Obstetrics & Gynecology

## 2022-02-13 DIAGNOSIS — M79644 Pain in right finger(s): Secondary | ICD-10-CM | POA: Diagnosis not present

## 2022-03-03 ENCOUNTER — Other Ambulatory Visit: Payer: Self-pay

## 2022-03-03 ENCOUNTER — Ambulatory Visit (INDEPENDENT_AMBULATORY_CARE_PROVIDER_SITE_OTHER): Payer: Medicare HMO | Admitting: Obstetrics & Gynecology

## 2022-03-03 ENCOUNTER — Encounter (HOSPITAL_BASED_OUTPATIENT_CLINIC_OR_DEPARTMENT_OTHER): Payer: Self-pay | Admitting: Obstetrics & Gynecology

## 2022-03-03 ENCOUNTER — Other Ambulatory Visit (HOSPITAL_COMMUNITY)
Admission: RE | Admit: 2022-03-03 | Discharge: 2022-03-03 | Disposition: A | Discharge status: Home / Self Care | Payer: Medicare HMO | Source: Ambulatory Visit | Attending: Obstetrics & Gynecology | Admitting: Obstetrics & Gynecology

## 2022-03-03 VITALS — BP 111/72 | HR 68 | Ht 60.5 in | Wt 111.2 lb

## 2022-03-03 DIAGNOSIS — Z01419 Encounter for gynecological examination (general) (routine) without abnormal findings: Secondary | ICD-10-CM

## 2022-03-03 DIAGNOSIS — R1032 Left lower quadrant pain: Secondary | ICD-10-CM | POA: Diagnosis not present

## 2022-03-03 DIAGNOSIS — M810 Age-related osteoporosis without current pathological fracture: Secondary | ICD-10-CM | POA: Insufficient documentation

## 2022-03-03 DIAGNOSIS — Z124 Encounter for screening for malignant neoplasm of cervix: Secondary | ICD-10-CM

## 2022-03-03 DIAGNOSIS — K869 Disease of pancreas, unspecified: Secondary | ICD-10-CM

## 2022-03-03 DIAGNOSIS — D696 Thrombocytopenia, unspecified: Secondary | ICD-10-CM | POA: Diagnosis not present

## 2022-03-03 NOTE — Progress Notes (Signed)
71 y.o. G0P0 Married White or Caucasian female here for breast and pelvic exam.  I am also following her for osteoporosis.  She is on Fosamax.  She needs repeat BMD this year.  Is on alendronate and doing well with this.  Doesn't think she did a mammogram last year. ? ?Denies vaginal bleeding. ? ?H/o LLQ pain that has been persistent for years.  Had CT scan 08/2021.  Pancreatic lesion found. She is still following up with Dr. Clovis Riley regarding this.  Was seen within the last six months.   ? ?Did have ultrasound that showed abnormal endometrium so hysteroscopy was performed.  This did not help pain at all.  Has also undergone normal colonoscopy.   ? ?Patient's last menstrual period was 12/14/1985.          ?Sexually active: Yes.    ?H/O STD:  no ? ?Health Maintenance: ?PCP:  Sammuel Hines, PA.  Last wellness appt was just last month.  Did blood work at that appt:   ?Vaccines are up to date:  had not done shingrix ?Colonoscopy:  07/31/2019, no follow up recommended ?MMG:  05/19/2019 Negative.  Pt thinks she's had one since then ?BMD:  05/19/2019 Osteoporosis ?Last pap smear:  11/19/2017 Negative.   ?H/o abnormal pap smear:  no ? ? reports that she quit smoking about 35 years ago. Her smoking use included cigarettes. She started smoking about 42 years ago. She has a 3.50 pack-year smoking history. She has never used smokeless tobacco. She reports that she does not drink alcohol and does not use drugs. ? ?Past Medical History:  ?Diagnosis Date  ? Anxiety   ? Arthritis   ? Bursitis   ? Chronic constipation   ? takes magnesium and probiotic  ? Chronic migraine   ? neurology-- barbara beck PA ([facility])  ? DDD (degenerative disc disease), lumbar 2011  ? Moderate  ? Environmental and seasonal allergies   ? Hematoma of leg   ? due to trauma in her 64s  ? Hepatic cyst 2011  ? Multiple hepatic cysts, largest measuring 5 cm  ? Lumbar spondylosis   ? Osteoporosis   ? Thrombocytopenia ([place])   ? chronic  (previously  followed by dr Marin Olp, released 2015)  ? Wears glasses   ? ? ?Past Surgical History:  ?Procedure Laterality Date  ? ANAL FISSURE REPAIR    ? BLEPHAROPLASTY Bilateral   ? BUNIONECTOMY Bilateral   ? CATARACT EXTRACTION, BILATERAL    ? feb & march 2021  ? COLONOSCOPY  last one 2011  ? DILATATION & CURETTAGE/HYSTEROSCOPY WITH MYOSURE N/A 05/05/2019  ? Procedure: DILATATION & CURETTAGE/HYSTEROSCOPY WITH MYOSURE RESECTION OF ENDOMETRIAL LESION;  Surgeon: Megan Salon, MD;  Location: [place];  Service: Gynecology;  Laterality: N/A;  endometrial polyp  ? HARDWARE REMOVAL  04/14/2012  ? Procedure: HARDWARE REMOVAL;  Surgeon: Linna Hoff, MD;  Location: [place];  Service: Orthopedics;  Laterality: Left;  left wrist deep hardware excision  ? ORIF WRIST FRACTURE Left 2011  ? THUMB SURGERY Right 04/17/2015  ? ? ?Current Outpatient Medications  ?Medication Sig Dispense Refill  ? alendronate (FOSAMAX) 70 MG tablet TAKE 1 TABLET BY MOUTH ONCE A WEEK. TAKE WITH A FULL GLASS OF WATER ON AN EMPTY STOMACH. 12 tablet 1  ? BIOTIN PO Take by mouth.    ? Cholecalciferol (VITAMIN D3) 1000 UNITS CAPS Take by mouth daily.    ? citalopram (CELEXA) 20 MG tablet Take by  mouth every morning.     ? clonazePAM (KLONOPIN) 1 MG tablet Take 1 tablet (1 mg total) by mouth daily. (Patient taking differently: Take 1 mg by mouth every evening.) 30 tablet 1  ? cyanocobalamin 1000 MCG tablet Take by mouth.    ? Fremanezumab-vfrm (AJOVY) 225 MG/1.5ML SOAJ Inject into the skin.    ? Magnesium 500 MG TABS Take by mouth daily.    ? promethazine (PHENERGAN) 12.5 MG tablet Take 12.5 mg by mouth every 6 (six) hours as needed.      ? pyridoxine (B-6) 100 MG tablet Take by mouth.    ? SUMAtriptan (IMITREX) 100 MG tablet TAKE 1 TABLET BY MOUTH FOR MIGRAINE.MAY REPEAT IN 2 HRS. NO MORE THAN 2TABS IN 24HRS OR 2-3DAYS/WK  1  ? Melatonin 10 MG TABS Take by mouth at bedtime. (Patient not taking: Reported on 03/03/2022)    ?  tiZANidine (ZANAFLEX) 4 MG capsule Take by mouth. (Patient not taking: Reported on 06/27/2020)    ? ?No current facility-administered medications for this visit.  ? ? ?Family History  ?Problem Relation Age of Onset  ? Diabetes Mother   ? Hypertension Mother   ? Stroke Mother   ? Dementia Mother   ? Heart disease Father   ? Cancer Father   ?     lung cancer  ? ? ?Review of Systems  ?Genitourinary:   ?     LLQ pain  ?All other systems reviewed and are negative. ? ?Exam:   ?BP 111/72 (BP Location: Right Arm, Patient Position: Sitting, Cuff Size: Normal)   Pulse 68   Ht 5' 0.5" (1.537 m) Comment: reported  Wt 111 lb 3.2 oz (50.4 kg)   LMP 12/14/1985   BMI 21.36 kg/m?   Height: 5' 0.5" (153.7 cm) (reported) ? ?General appearance: alert, cooperative and appears stated age ?Breasts: normal appearance, no masses or tenderness ?Abdomen: soft, non-tender; bowel sounds normal; no masses,  no organomegaly ?Lymph nodes: Cervical, supraclavicular, and axillary nodes normal.  No abnormal inguinal nodes palpated ?Neurologic: Grossly normal ? ?Pelvic: External genitalia:  no lesions ?             Urethra:  normal appearing urethra with no masses, tenderness or lesions ?             Bartholins and Skenes: normal    ?             Vagina: normal appearing vagina with atrophic changes and no discharge, no lesions ?             Cervix: no lesions ?             Pap taken: No. ?Bimanual Exam:  Uterus:  normal size, contour, position, consistency, mobility, non-tender ?             Adnexa: normal adnexa and no mass, fullness, tenderness ?              Rectovaginal: Confirms ?              Anus:  normal sphincter tone, no lesions ? ?Chaperone, Octaviano Batty, CMA, was present for exam. ? ?Assessment/Plan: ?1. Encntr for gyn exam (general) (routine) w/o abn findings ?- pap obtained today ?- colonoscopy 07/2019.  No additional follow up recommended. ?- MMG 05/19/2019.  Pt aware this is due. ?- lab work done with Sammuel Hines, PA, and just  done last month ?- vaccines reviewed/updated ? ?2. Cervical cancer screening ?- Cytology - PAP(  [facility]) ?- PR OBTAINING SCREEN PAP SMEAR ? ?3. Age-related osteoporosis without current pathological fracture ?- on Alendronate 70 mg orally weekly.  Last rx done 02/06/2022 for #12/1RF. ?- Repeat BMD due and order placed ?- DG BONE DENSITY (DXA); Future ? ?4. Pancreatic disorder ?- followed by Dr. Clovis Riley at [facility] ? ?5. Thrombocytopenia ([place]) ?- was followed by hematology but released in 2015 ? ?6. LLQ pain  ? ? ?

## 2022-03-05 LAB — CYTOLOGY - PAP: Diagnosis: NEGATIVE

## 2022-03-06 DIAGNOSIS — R1032 Left lower quadrant pain: Secondary | ICD-10-CM | POA: Insufficient documentation

## 2022-03-09 DIAGNOSIS — H5213 Myopia, bilateral: Secondary | ICD-10-CM | POA: Diagnosis not present

## 2022-03-26 ENCOUNTER — Other Ambulatory Visit (HOSPITAL_BASED_OUTPATIENT_CLINIC_OR_DEPARTMENT_OTHER): Payer: Self-pay | Admitting: Obstetrics & Gynecology

## 2022-03-26 ENCOUNTER — Other Ambulatory Visit: Payer: Self-pay | Admitting: Obstetrics & Gynecology

## 2022-03-26 DIAGNOSIS — Z1231 Encounter for screening mammogram for malignant neoplasm of breast: Secondary | ICD-10-CM

## 2022-03-26 DIAGNOSIS — M810 Age-related osteoporosis without current pathological fracture: Secondary | ICD-10-CM

## 2022-03-31 DIAGNOSIS — S62632A Displaced fracture of distal phalanx of right middle finger, initial encounter for closed fracture: Secondary | ICD-10-CM | POA: Diagnosis not present

## 2022-05-12 DIAGNOSIS — S62632A Displaced fracture of distal phalanx of right middle finger, initial encounter for closed fracture: Secondary | ICD-10-CM | POA: Diagnosis not present

## 2022-05-14 DIAGNOSIS — Z0000 Encounter for general adult medical examination without abnormal findings: Secondary | ICD-10-CM | POA: Diagnosis not present

## 2022-05-14 DIAGNOSIS — G4700 Insomnia, unspecified: Secondary | ICD-10-CM | POA: Diagnosis not present

## 2022-05-14 DIAGNOSIS — Z136 Encounter for screening for cardiovascular disorders: Secondary | ICD-10-CM | POA: Diagnosis not present

## 2022-05-21 DIAGNOSIS — E785 Hyperlipidemia, unspecified: Secondary | ICD-10-CM | POA: Diagnosis not present

## 2022-05-27 DIAGNOSIS — J029 Acute pharyngitis, unspecified: Secondary | ICD-10-CM | POA: Diagnosis not present

## 2022-06-29 ENCOUNTER — Encounter: Payer: Self-pay | Admitting: Podiatry

## 2022-06-29 ENCOUNTER — Ambulatory Visit (INDEPENDENT_AMBULATORY_CARE_PROVIDER_SITE_OTHER): Payer: Medicare HMO

## 2022-06-29 ENCOUNTER — Ambulatory Visit (INDEPENDENT_AMBULATORY_CARE_PROVIDER_SITE_OTHER): Payer: Medicare HMO | Admitting: Podiatry

## 2022-06-29 DIAGNOSIS — M21612 Bunion of left foot: Secondary | ICD-10-CM

## 2022-06-29 DIAGNOSIS — M7751 Other enthesopathy of right foot: Secondary | ICD-10-CM | POA: Diagnosis not present

## 2022-06-29 DIAGNOSIS — M21611 Bunion of right foot: Secondary | ICD-10-CM | POA: Diagnosis not present

## 2022-06-29 DIAGNOSIS — M21619 Bunion of unspecified foot: Secondary | ICD-10-CM | POA: Diagnosis not present

## 2022-07-01 DIAGNOSIS — M7751 Other enthesopathy of right foot: Secondary | ICD-10-CM | POA: Diagnosis not present

## 2022-07-01 DIAGNOSIS — M21619 Bunion of unspecified foot: Secondary | ICD-10-CM | POA: Diagnosis not present

## 2022-07-01 MED ORDER — TRIAMCINOLONE ACETONIDE 10 MG/ML IJ SUSP
10.0000 mg | Freq: Once | INTRAMUSCULAR | Status: AC
  Administered 2022-07-01: 10 mg

## 2022-07-01 NOTE — Progress Notes (Signed)
Subjective:   Patient ID: [name], female   DOB: 71 y.o.   MRN: [id number]   HPI Patient states she has developed more pain around her bunion right and also has pain in the second joint right that has become more sore.  States that she knows that she needs the bunion fixed like the left one which we have done a number years ago but would like to hold off currently and states she is having trouble walking on her forefoot.  Patient does not smoke likes to be active   Review of Systems  All other systems reviewed and are negative.       Objective:  Physical Exam Vitals and nursing note reviewed.  Constitutional:      Appearance: She is well-developed.  Pulmonary:     Effort: Pulmonary effort is normal.  Musculoskeletal:        General: Normal range of motion.  Skin:    General: Skin is warm.  Neurological:     Mental Status: She is alert.     Neurovascular status intact muscle strength found to be adequate range of motion adequate with subtalar midtarsal joint adequate.  Patient is found to have inflammation and redness around the first MPJ of the right foot with fluid buildup noted around the joint surface and is noted to have inflammation pain more acutely in the second metatarsal phalangeal joint with enlargement of the joint surface.  Patient has good digital perfusion well oriented     Assessment:  Inflammatory capsulitis of the second MPJ right with pain noted with patient also noted to have structural bunion which will probably need to be corrected at 1 point     Plan:  H&P reviewed conditions and discussed both and discussed bunion correction at 1 point in future educated on distal osteotomy.  Today I went ahead on focusing on the second joint I did do a proximal nerve block I aspirated the second MPJ I was able to get out a small amount of clear fluid I injected quarter cc dexamethasone Kenalog I applied pad to reduce pressure and advised on rigid bottom shoes and  reappoint to recheck  X-rays indicate that there is structural bunion deformity right with no indication stress fracture arthritis of the second MPJ right

## 2022-07-20 ENCOUNTER — Encounter: Payer: Self-pay | Admitting: Podiatry

## 2022-07-20 ENCOUNTER — Ambulatory Visit (INDEPENDENT_AMBULATORY_CARE_PROVIDER_SITE_OTHER): Payer: Medicare HMO | Admitting: Podiatry

## 2022-07-20 DIAGNOSIS — M21619 Bunion of unspecified foot: Secondary | ICD-10-CM | POA: Diagnosis not present

## 2022-07-20 DIAGNOSIS — M7751 Other enthesopathy of right foot: Secondary | ICD-10-CM

## 2022-07-20 NOTE — Progress Notes (Signed)
Subjective:   Patient ID: [name], female   DOB: 71 y.o.   MRN: [id number]   HPI Patient presents stating I am feeling quite a bit better now I need to get my bunion fixed like the one on my left foot but not sure on my timing.  Also have concerns about the second nail on my right foot being thick   ROS      Objective:  Physical Exam  Neurovascular status intact with diminished discomfort around the second MPJ right still mildly tender but much better with a moderate thickness of the second nailbed right but lifting of the second toe and significant structural bunion deformity right with redness     Assessment:  Thick mycotic nail infection second right but more due to trauma that has created this with structural bunion improved capsulitis     Plan:  H&P reviewed all conditions and at this point I have recommended long-term wider type shoe gear and we discussed bunion correction she wants to get it done but needs to look at her schedule decide when is best and will call to schedule and that I will see her prior to surgery with education today concerning what the recovery process would be like with the type of distal osteotomy she needs.  Do not recommend treatment for the nail but did explain causes of nail condition and elevation of the second toe

## 2022-07-22 DIAGNOSIS — F5104 Psychophysiologic insomnia: Secondary | ICD-10-CM | POA: Diagnosis not present

## 2022-07-22 DIAGNOSIS — F411 Generalized anxiety disorder: Secondary | ICD-10-CM | POA: Diagnosis not present

## 2022-07-22 DIAGNOSIS — R69 Illness, unspecified: Secondary | ICD-10-CM | POA: Diagnosis not present

## 2022-07-26 ENCOUNTER — Other Ambulatory Visit: Payer: Self-pay | Admitting: Obstetrics & Gynecology

## 2022-07-27 DIAGNOSIS — Z79899 Other long term (current) drug therapy: Secondary | ICD-10-CM | POA: Diagnosis not present

## 2022-07-27 DIAGNOSIS — G43709 Chronic migraine without aura, not intractable, without status migrainosus: Secondary | ICD-10-CM | POA: Diagnosis not present

## 2022-08-11 DIAGNOSIS — S62632A Displaced fracture of distal phalanx of right middle finger, initial encounter for closed fracture: Secondary | ICD-10-CM | POA: Diagnosis not present

## 2022-08-24 DIAGNOSIS — E785 Hyperlipidemia, unspecified: Secondary | ICD-10-CM | POA: Diagnosis not present

## 2022-09-07 DIAGNOSIS — R69 Illness, unspecified: Secondary | ICD-10-CM | POA: Diagnosis not present

## 2022-09-07 DIAGNOSIS — E785 Hyperlipidemia, unspecified: Secondary | ICD-10-CM | POA: Diagnosis not present

## 2022-09-07 DIAGNOSIS — G4719 Other hypersomnia: Secondary | ICD-10-CM | POA: Diagnosis not present

## 2022-09-21 ENCOUNTER — Ambulatory Visit: Payer: Medicare HMO

## 2022-09-21 ENCOUNTER — Other Ambulatory Visit: Payer: Medicare HMO

## 2022-10-12 DIAGNOSIS — J069 Acute upper respiratory infection, unspecified: Secondary | ICD-10-CM | POA: Diagnosis not present

## 2022-10-12 DIAGNOSIS — Z23 Encounter for immunization: Secondary | ICD-10-CM | POA: Diagnosis not present

## 2022-10-14 ENCOUNTER — Emergency Department (HOSPITAL_BASED_OUTPATIENT_CLINIC_OR_DEPARTMENT_OTHER)
Admission: EM | Admit: 2022-10-14 | Discharge: 2022-10-14 | Disposition: A | Discharge status: Home / Self Care | Payer: Medicare HMO | Attending: Emergency Medicine | Admitting: Emergency Medicine

## 2022-10-14 ENCOUNTER — Emergency Department (HOSPITAL_BASED_OUTPATIENT_CLINIC_OR_DEPARTMENT_OTHER): Payer: Medicare HMO

## 2022-10-14 ENCOUNTER — Other Ambulatory Visit: Payer: Self-pay

## 2022-10-14 ENCOUNTER — Encounter (HOSPITAL_BASED_OUTPATIENT_CLINIC_OR_DEPARTMENT_OTHER): Payer: Self-pay | Admitting: Emergency Medicine

## 2022-10-14 DIAGNOSIS — W010XXA Fall on same level from slipping, tripping and stumbling without subsequent striking against object, initial encounter: Secondary | ICD-10-CM | POA: Diagnosis not present

## 2022-10-14 DIAGNOSIS — W19XXXA Unspecified fall, initial encounter: Secondary | ICD-10-CM

## 2022-10-14 DIAGNOSIS — Z91040 Latex allergy status: Secondary | ICD-10-CM | POA: Diagnosis not present

## 2022-10-14 DIAGNOSIS — S93402A Sprain of unspecified ligament of left ankle, initial encounter: Secondary | ICD-10-CM | POA: Insufficient documentation

## 2022-10-14 DIAGNOSIS — M79672 Pain in left foot: Secondary | ICD-10-CM | POA: Diagnosis not present

## 2022-10-14 DIAGNOSIS — M79662 Pain in left lower leg: Secondary | ICD-10-CM | POA: Diagnosis not present

## 2022-10-14 DIAGNOSIS — S99912A Unspecified injury of left ankle, initial encounter: Secondary | ICD-10-CM | POA: Diagnosis not present

## 2022-10-14 DIAGNOSIS — M25572 Pain in left ankle and joints of left foot: Secondary | ICD-10-CM | POA: Diagnosis not present

## 2022-10-14 NOTE — Discharge Instructions (Addendum)
Your x-rays today did not show any evidence of broken bones.  This is likely an ankle sprain.  We have placed you in a help support your ankle.  I suggest ice, Tylenol, Motrin, elevation of the leg.  Follow-up with orthopedics if your symptoms do not improve after 1 week.  Return for new or worsening symptoms

## 2022-10-14 NOTE — ED Provider Notes (Signed)
[place] EMERGENCY DEPARTMENT Provider Note   CSN: [id number] Arrival date & time: 10/14/22  1413    History  Chief Complaint  Patient presents with   Ankle Injury    left    [name] is a 71 y.o. female here for evaluation of left pain after fall.  Went to walk the dog in the middle of the night when she missed a step down, twisting of her left foot, ankle.  She has pain from her proximal fibula distally into her foot.  She was able to ambulate however has a limp.  No redness, swelling or warmth.  Was unable to bowl today leading her here for evaluation.  No meds PTA.  No numbness or weakness. Denies hitting head, LOC anticoagulation.   HPI     Home Medications Prior to Admission medications   Medication Sig Start Date End Date Taking? Authorizing Provider  alendronate (FOSAMAX) 70 MG tablet TAKE 1 TABLET BY MOUTH ONCE A WEEK. TAKE WITH A FULL GLASS OF WATER ON AN EMPTY STOMACH. 07/27/22   Megan Salon, MD  BIOTIN PO Take by mouth.    [provider]  Cholecalciferol (VITAMIN D3) 1000 UNITS CAPS Take by mouth daily.    [provider]  citalopram (CELEXA) 20 MG tablet Take by mouth every morning.  07/30/17   [provider]  clonazePAM (KLONOPIN) 1 MG tablet Take 1 tablet (1 mg total) by mouth daily. Patient taking differently: Take 1 mg by mouth every evening. 12/29/17   Megan Salon, MD  cyanocobalamin 1000 MCG tablet Take by mouth.    [provider]  Fremanezumab-vfrm (AJOVY) 225 MG/1.5ML SOAJ Inject into the skin.    [provider]  Magnesium 500 MG TABS Take by mouth daily.    [provider]  promethazine (PHENERGAN) 12.5 MG tablet Take 12.5 mg by mouth every 6 (six) hours as needed.      [provider]  pyridoxine (B-6) 100 MG tablet Take by mouth.    [provider]  rosuvastatin (CRESTOR) 5 MG tablet 1 tablet 05/25/22   [provider]  SUMAtriptan (IMITREX) 100 MG  tablet TAKE 1 TABLET BY MOUTH FOR MIGRAINE.MAY REPEAT IN 2 HRS. NO MORE THAN 2TABS IN 24HRS OR 2-3DAYS/WK 12/05/16   [provider]  zolpidem (AMBIEN) 10 MG tablet 1 tablet at bedtime as needed 05/14/22   [provider]      Allergies    Corticosteroids, Levonorgestrel-ethinyl estrad, Cortisone, Other, and Latex    Review of Systems   Review of Systems  Constitutional: Negative.   HENT: Negative.    Respiratory: Negative.    Cardiovascular: Negative.   Genitourinary: Negative.   Musculoskeletal:        Left leg pain  Skin: Negative.   Neurological: Negative.   All other systems reviewed and are negative.   Physical Exam Updated Vital Signs BP (!) 150/139   Pulse 77   Temp 98.1 F (36.7 C) (Oral)   Resp 16   Ht 5' 0.5" (1.537 m)   Wt 49.9 kg   LMP 12/14/1985   SpO2 100%   BMI 21.13 kg/m  Physical Exam Vitals and nursing note reviewed.  Constitutional:      General: She is not in acute distress.    Appearance: She is well-developed. She is not ill-appearing, toxic-appearing or diaphoretic.  HENT:     Head: Normocephalic and atraumatic.     Nose: Nose normal.  Mouth/Throat:     Mouth: Mucous membranes are moist.  Eyes:     Pupils: Pupils are equal, round, and reactive to light.  Cardiovascular:     Rate and Rhythm: Normal rate.     Pulses: Normal pulses.     Heart sounds: Normal heart sounds.  Pulmonary:     Effort: Pulmonary effort is normal. No respiratory distress.     Breath sounds: Normal breath sounds.  Abdominal:     General: Bowel sounds are normal. There is no distension.     Palpations: Abdomen is soft.  Musculoskeletal:        General: Normal range of motion.     Cervical back: Normal range of motion.     Comments: Tenderness left fibular head distally into left lateral malleolus and foot. Full range of motion. Able to plantarflex and dorsiflex at difficult fluid.  Nontender knee.  No erythema, warmth  Skin:    General: Skin is  warm and dry.  Neurological:     General: No focal deficit present.     Mental Status: She is alert.  Psychiatric:        Mood and Affect: Mood normal.    ED Results / Procedures / Treatments   Labs (all labs ordered are listed, but only abnormal results are displayed) Labs Reviewed - No data to display  EKG None  Radiology DG Tibia/Fibula Left  Result Date: 10/14/2022 CLINICAL DATA:  Pain EXAM: LEFT TIBIA AND FIBULA - 2 VIEW COMPARISON:  None Available. FINDINGS: There is no evidence of fracture or other focal bone lesions. Soft tissues are unremarkable. IMPRESSION: Negative. Electronically Signed   By: Yetta Glassman M.D.   On: 10/14/2022 16:39   DG Ankle Complete Left  Result Date: 10/14/2022 CLINICAL DATA:  Left ankle pain after injury. EXAM: LEFT ANKLE COMPLETE - 3+ VIEW COMPARISON:  None Available. FINDINGS: There is no evidence of fracture, dislocation, or joint effusion. There is no evidence of arthropathy or other focal bone abnormality. Soft tissues are unremarkable. IMPRESSION: Negative. Electronically Signed   By: Marijo Conception M.D.   On: 10/14/2022 14:54   DG Foot Complete Left  Result Date: 10/14/2022 CLINICAL DATA:  Left foot pain after injury. EXAM: LEFT FOOT - COMPLETE 3+ VIEW COMPARISON:  None Available. FINDINGS: There is no evidence of fracture or dislocation. Joint spaces are unremarkable. Surgical pin is seen in distal first metatarsal. Soft tissues are unremarkable. IMPRESSION: No acute abnormality seen. Electronically Signed   By: Marijo Conception M.D.   On: 10/14/2022 14:53    Procedures .Ortho Injury Treatment  Date/Time: 10/14/2022 5:03 PM  Performed by: Shelby Dubin A, PA-C Authorized by: Nettie Elm, PA-C   Consent:    Consent obtained:  Verbal   Consent given by:  Patient   Risks discussed:  Fracture, nerve damage, restricted joint movement, vascular damage, stiffness, recurrent dislocation and irreducible dislocation   Alternatives  discussed:  No treatment, alternative treatment, immobilization, referral and delayed treatmentInjury location: ankle Location details: left ankle Injury type: soft tissue Pre-procedure neurovascular assessment: neurovascularly intact Pre-procedure distal perfusion: normal Pre-procedure neurological function: normal Pre-procedure range of motion: normal  Anesthesia: Local anesthesia used: no  Patient sedated: NoImmobilization: brace Splint type: aso brace. Splint Applied by: ED Tech Supplies used: pre fac aso brace. Post-procedure neurovascular assessment: post-procedure neurovascularly intact Post-procedure distal perfusion: normal Post-procedure neurological function: normal Post-procedure range of motion: normal       Medications Ordered in ED Medications -  No data to display  ED Course/ Medical Decision Making/ A&P   71 year old here for evaluation of left leg pain after mechanical yesterday causing inversion injury. Pain to left proximal fib into left lateral malleolus and left foot.  Ambulatory here with a limp.  She is neurovascular intact.  No redness or warmth.  We will plan on imaging  Imaging personally viewed and interpreted:  Xray left ankle without significant abnormality Xray left foot without significant abnormality Xray left tib fib fracture, dislocation   Patient reassessed.  Discussed imaging.  Suspect sprain or strain.  Discussed Tylenol, Motrin, return for new or worsening symptoms otherwise follow-up orthopedics.  The patient has been appropriately medically screened and/or stabilized in the ED. I have low suspicion for any other emergent medical condition which would require further screening, evaluation or treatment in the ED or require inpatient management.  Patient is hemodynamically stable and in no acute distress.  Patient able to ambulate in department prior to ED.  Evaluation does not show acute pathology that would require ongoing or  additional emergent interventions while in the emergency department or further inpatient treatment.  I have discussed the diagnosis with the patient and answered all questions.  Pain is been managed while in the emergency department and patient has no further complaints prior to discharge.  Patient is comfortable with plan discussed in room and is stable for discharge at this time.  I have discussed strict return precautions for returning to the emergency department.  Patient was encouraged to follow-up with PCP/specialist refer to at discharge.                           Medical Decision Making Amount and/or Complexity of Data Reviewed External Data Reviewed: radiology and notes. Radiology: ordered and independent interpretation performed. Decision-making details documented in ED Course.  Risk OTC drugs. Prescription drug management. Diagnosis or treatment significantly limited by social determinants of health.          Final Clinical Impression(s) / ED Diagnoses Final diagnoses:  Fall, initial encounter  Sprain of left ankle, unspecified ligament, initial encounter    Rx / DC Orders ED Discharge Orders     None         Nguyen Butler A, PA-C 10/14/22 1704    Charlesetta Shanks, MD 11/05/22 [phone]

## 2022-10-14 NOTE — ED Triage Notes (Signed)
Left ankle injury today , missed a step and got her foot twisted , denies falling . Ambulatory with obvious discomfort , some swelling noted .

## 2022-10-28 DIAGNOSIS — E860 Dehydration: Secondary | ICD-10-CM | POA: Diagnosis not present

## 2022-11-16 DIAGNOSIS — J309 Allergic rhinitis, unspecified: Secondary | ICD-10-CM | POA: Diagnosis not present

## 2022-11-16 DIAGNOSIS — R69 Illness, unspecified: Secondary | ICD-10-CM | POA: Diagnosis not present

## 2022-11-16 DIAGNOSIS — E785 Hyperlipidemia, unspecified: Secondary | ICD-10-CM | POA: Diagnosis not present

## 2022-11-16 DIAGNOSIS — F411 Generalized anxiety disorder: Secondary | ICD-10-CM | POA: Diagnosis not present

## 2022-11-16 DIAGNOSIS — F5104 Psychophysiologic insomnia: Secondary | ICD-10-CM | POA: Diagnosis not present

## 2023-01-06 DIAGNOSIS — R69 Illness, unspecified: Secondary | ICD-10-CM | POA: Diagnosis not present

## 2023-01-11 ENCOUNTER — Other Ambulatory Visit: Payer: Self-pay | Admitting: Obstetrics & Gynecology

## 2023-01-12 DIAGNOSIS — Z79899 Other long term (current) drug therapy: Secondary | ICD-10-CM | POA: Diagnosis not present

## 2023-01-12 DIAGNOSIS — M542 Cervicalgia: Secondary | ICD-10-CM | POA: Diagnosis not present

## 2023-01-12 DIAGNOSIS — R69 Illness, unspecified: Secondary | ICD-10-CM | POA: Diagnosis not present

## 2023-01-12 DIAGNOSIS — G43709 Chronic migraine without aura, not intractable, without status migrainosus: Secondary | ICD-10-CM | POA: Diagnosis not present

## 2023-01-22 DIAGNOSIS — R29898 Other symptoms and signs involving the musculoskeletal system: Secondary | ICD-10-CM | POA: Diagnosis not present

## 2023-01-22 DIAGNOSIS — M542 Cervicalgia: Secondary | ICD-10-CM | POA: Diagnosis not present

## 2023-02-01 DIAGNOSIS — M542 Cervicalgia: Secondary | ICD-10-CM | POA: Diagnosis not present

## 2023-02-01 DIAGNOSIS — R29898 Other symptoms and signs involving the musculoskeletal system: Secondary | ICD-10-CM | POA: Diagnosis not present

## 2023-02-10 DIAGNOSIS — R69 Illness, unspecified: Secondary | ICD-10-CM | POA: Diagnosis not present

## 2023-03-07 ENCOUNTER — Other Ambulatory Visit: Payer: Self-pay | Admitting: Obstetrics & Gynecology

## 2023-03-17 NOTE — Telephone Encounter (Signed)
LMOVM for pt tp call office regarding refill----did pt have bone density test done that was ordered last year?

## 2023-03-19 ENCOUNTER — Encounter (HOSPITAL_BASED_OUTPATIENT_CLINIC_OR_DEPARTMENT_OTHER): Payer: Self-pay | Admitting: *Deleted

## 2023-03-19 NOTE — Telephone Encounter (Signed)
LM for pt to call the office.  Will send MyChart message as well. KW CMA

## 2023-03-26 DIAGNOSIS — S76011A Strain of muscle, fascia and tendon of right hip, initial encounter: Secondary | ICD-10-CM | POA: Diagnosis not present

## 2023-04-07 NOTE — Telephone Encounter (Signed)
Called and spoke with patient. Patient would like to be referred for bone density again.

## 2023-04-09 ENCOUNTER — Other Ambulatory Visit (HOSPITAL_BASED_OUTPATIENT_CLINIC_OR_DEPARTMENT_OTHER): Payer: Self-pay | Admitting: *Deleted

## 2023-04-09 DIAGNOSIS — M810 Age-related osteoporosis without current pathological fracture: Secondary | ICD-10-CM

## 2023-04-11 ENCOUNTER — Other Ambulatory Visit (HOSPITAL_BASED_OUTPATIENT_CLINIC_OR_DEPARTMENT_OTHER): Payer: Self-pay | Admitting: Obstetrics & Gynecology

## 2023-04-11 MED ORDER — ALENDRONATE SODIUM 70 MG PO TABS: ORAL_TABLET | ORAL | 0 refills | Status: DC

## 2023-04-21 NOTE — Telephone Encounter (Signed)
Called pt to advise that bone density test should be done sooner than October if possible. Pt states that October was the [facility]. Advised that she could have the imaging done at [facility]. Pt would like to proceed with scheduling at [facility].

## 2023-04-22 NOTE — Addendum Note (Signed)
Addended by: Harrie Jeans on: 04/22/2023 03:05 PM   Modules accepted: Orders

## 2023-04-22 NOTE — Telephone Encounter (Signed)
Called pt to advised that imaging here at [facility] can get her in next week for mammogram and bone density. Pt to call and schedule appt.

## 2023-04-30 ENCOUNTER — Ambulatory Visit (HOSPITAL_BASED_OUTPATIENT_CLINIC_OR_DEPARTMENT_OTHER)
Admission: RE | Admit: 2023-04-30 | Discharge: 2023-04-30 | Disposition: A | Discharge status: Home / Self Care | Payer: Medicare HMO | Source: Ambulatory Visit | Attending: Obstetrics & Gynecology | Admitting: Obstetrics & Gynecology

## 2023-04-30 DIAGNOSIS — Z1231 Encounter for screening mammogram for malignant neoplasm of breast: Secondary | ICD-10-CM | POA: Insufficient documentation

## 2023-04-30 DIAGNOSIS — M810 Age-related osteoporosis without current pathological fracture: Secondary | ICD-10-CM | POA: Diagnosis not present

## 2023-04-30 DIAGNOSIS — M8589 Other specified disorders of bone density and structure, multiple sites: Secondary | ICD-10-CM | POA: Insufficient documentation

## 2023-05-19 ENCOUNTER — Encounter (HOSPITAL_BASED_OUTPATIENT_CLINIC_OR_DEPARTMENT_OTHER): Payer: Self-pay | Admitting: Obstetrics & Gynecology

## 2023-05-19 ENCOUNTER — Ambulatory Visit (INDEPENDENT_AMBULATORY_CARE_PROVIDER_SITE_OTHER): Payer: Medicare HMO | Admitting: Obstetrics & Gynecology

## 2023-05-19 VITALS — BP 115/69 | HR 64 | Ht 60.75 in | Wt 110.8 lb

## 2023-05-19 DIAGNOSIS — M25551 Pain in right hip: Secondary | ICD-10-CM

## 2023-05-19 DIAGNOSIS — M810 Age-related osteoporosis without current pathological fracture: Secondary | ICD-10-CM

## 2023-05-19 NOTE — Progress Notes (Signed)
Subjective:    64 yrs Married Caucasian G0P0  female here to discuss recent BMD obtained 04/30/2023 showing osteoporosis.  Worst T score was -2.9.  prior testing was done in 2020 and right hip T score was -2.6.  Discussed findings today and worsening results. Concerns for fracture risk discussed.      Likely unrelated, she reports moving a piece of equipment in the gyn with her leg/foot and having right hip pain afterwards.  Is better but not resolved and has been going on for several months.  Feel imaging and evaluation is warranted.  Has been ortho for hand issues.  Will refer to sports medicine now.   Osteoporosis Risk Factors  Nonmodifiable Personal Hx of fracture as an adult: yes - wrist in her 12s was ice skating Hx of fracture in first-degree relative: yes - mother broke her hip after tripping [facility]   Potentially modifiable: Tobacco use: no Low body weight (<127 lbs): yes Estrogen deficiency with either early menopause (age <45) or bilateral ovariectomy: no Low calcium intake (lifelong): yes, has constipation with additional calcium Alcohol use more than 2 drinks per day: no Inadequate physical activity: no.  Exercises with weights and chair yoga and machines, 3 times weekly  Current calcium and Vit D intake: D3  Past Medical History:  Diagnosis Date   Anxiety    Arthritis    Bursitis    Chronic constipation    takes magnesium and probiotic   Chronic migraine    neurology-- barbara beck PA ([facility])   DDD (degenerative disc disease), lumbar 2011   Moderate   Environmental and seasonal allergies    Hematoma of leg    due to trauma in her 2s   Hepatic cyst 2011   Multiple hepatic cysts, largest measuring 5 cm   Lumbar spondylosis    Osteoporosis    Thrombocytopenia (HCC)    chronic  (previously followed by dr Myna Hidalgo, released 2015)   Wears glasses    Current Outpatient Medications on File Prior to Visit  Medication Sig  Dispense Refill   alendronate (FOSAMAX) 70 MG tablet TAKE 1 TABLET BY MOUTH ONCE A WEEK. TAKE WITH A FULL GLASS OF WATER ON AN EMPTY STOMACH. 12 tablet 0   BIOTIN PO Take by mouth.     Cholecalciferol (VITAMIN D3) 1000 UNITS CAPS Take by mouth daily.     citalopram (CELEXA) 20 MG tablet Take by mouth every morning.      clonazePAM (KLONOPIN) 1 MG tablet Take 1 tablet (1 mg total) by mouth daily. (Patient taking differently: Take 1 mg by mouth every evening.) 30 tablet 1   cyanocobalamin 1000 MCG tablet Take by mouth.     Fremanezumab-vfrm (AJOVY) 225 MG/1.5ML SOAJ Inject into the skin.     Magnesium 500 MG TABS Take by mouth daily.     promethazine (PHENERGAN) 12.5 MG tablet Take 12.5 mg by mouth every 6 (six) hours as needed.       rosuvastatin (CRESTOR) 5 MG tablet 1 tablet     SUMAtriptan (IMITREX) 100 MG tablet TAKE 1 TABLET BY MOUTH FOR MIGRAINE.MAY REPEAT IN 2 HRS. NO MORE THAN 2TABS IN 24HRS OR 2-3DAYS/WK  1   zolpidem (AMBIEN) 10 MG tablet 1 tablet at bedtime as needed     No current facility-administered medications on file prior to visit.   Allergies  Allergen Reactions   Corticosteroids Anxiety   Levonorgestrel-Ethinyl Estrad Itching   Cortisone  Other     Allergy shots caused thrombocyopenia   Latex Rash    Review of Systems Pertinent items noted in HPI and remainder of comprehensive ROS otherwise negative.     Objective:   PHYSICAL EXAM BP 115/69 (BP Location: Left Arm, Patient Position: Sitting, Cuff Size: Normal)   Pulse 64   Ht 5' 0.75" (1.543 m) Comment: Reported  Wt 110 lb 12.8 oz (50.3 kg)   LMP 12/14/1985   BMI 21.11 kg/m  General appearance: alert and no distress  Imaging Bone Density: Spine T Score: -1.3, Hip T Score: -2.9.  Done on 04/30/2023. FRAX score:  not calculated due to presence of osteoporosis.                                         Assessment:   Osteoporosis of hip/femoral neck with T score -2.9  Likely unrelated hip pain from  injury   Plan:   1.  Treatment options with medications discussed including:   Bisphosphonates po and IV  Evist  Prolia subcutaneous   Forteo subcutaneous  Estrogen/progesterone therapy  Risks, benefits of medications, mechanism of action discussed.  Specifically atypical fractures and osteonecrosis discussed.  2.  Patient counseled in adequate calcium and vitamin D.  She is taking Vit D  3.  30 minutes of weight bearing exercise at least 3 times weekly recommended.  4.  Fall prevention discussed.  5.  Lab work ordered:  CMP, TSH, Vit D, PTH  6.  Repeat BMD 2 years.    7.  Unrelated hip pain likely due to injury:  referral to sports medicine place.  Total time with pt:  32 minutes Documentation:  5 minutes Total time:  37 minutes

## 2023-05-20 LAB — COMPREHENSIVE METABOLIC PANEL
ALT: 13 IU/L (ref 0–32)
AST: 27 IU/L (ref 0–40)
Albumin/Globulin Ratio: 1.7 (ref 1.2–2.2)
Albumin: 4.4 g/dL (ref 3.8–4.8)
Alkaline Phosphatase: 63 IU/L (ref 44–121)
BUN/Creatinine Ratio: 15 (ref 12–28)
BUN: 15 mg/dL (ref 8–27)
Bilirubin Total: 0.7 mg/dL (ref 0.0–1.2)
CO2: 25 mmol/L (ref 20–29)
Calcium: 9.5 mg/dL (ref 8.7–10.3)
Chloride: 102 mmol/L (ref 96–106)
Creatinine, Ser: 0.99 mg/dL (ref 0.57–1.00)
Globulin, Total: 2.6 g/dL (ref 1.5–4.5)
Glucose: 79 mg/dL (ref 70–99)
Potassium: 4.2 mmol/L (ref 3.5–5.2)
Sodium: 142 mmol/L (ref 134–144)
Total Protein: 7 g/dL (ref 6.0–8.5)
eGFR: 60 mL/min/{1.73_m2} (ref >59)

## 2023-05-20 LAB — PTH, INTACT AND CALCIUM
Calcium: 9.5 mg/dL (ref 8.7–10.3)
PTH: 38 pg/mL (ref 15–65)

## 2023-05-20 LAB — VITAMIN D 25 HYDROXY (VIT D DEFICIENCY, FRACTURES): Vit D, 25-Hydroxy: 50.3 ng/mL (ref 30.0–100.0)

## 2023-05-20 LAB — TSH: TSH: 2.26 u[IU]/mL (ref 0.450–4.500)

## 2023-05-21 DIAGNOSIS — E785 Hyperlipidemia, unspecified: Secondary | ICD-10-CM | POA: Diagnosis not present

## 2023-05-21 DIAGNOSIS — F411 Generalized anxiety disorder: Secondary | ICD-10-CM | POA: Diagnosis not present

## 2023-05-21 DIAGNOSIS — Z0000 Encounter for general adult medical examination without abnormal findings: Secondary | ICD-10-CM | POA: Diagnosis not present

## 2023-05-21 DIAGNOSIS — F5104 Psychophysiologic insomnia: Secondary | ICD-10-CM | POA: Diagnosis not present

## 2023-05-21 DIAGNOSIS — E559 Vitamin D deficiency, unspecified: Secondary | ICD-10-CM | POA: Diagnosis not present

## 2023-05-21 DIAGNOSIS — M810 Age-related osteoporosis without current pathological fracture: Secondary | ICD-10-CM | POA: Diagnosis not present

## 2023-05-21 DIAGNOSIS — G4700 Insomnia, unspecified: Secondary | ICD-10-CM | POA: Diagnosis not present

## 2023-05-26 DIAGNOSIS — S20461A Insect bite (nonvenomous) of right back wall of thorax, initial encounter: Secondary | ICD-10-CM | POA: Diagnosis not present

## 2023-05-26 DIAGNOSIS — W57XXXA Bitten or stung by nonvenomous insect and other nonvenomous arthropods, initial encounter: Secondary | ICD-10-CM | POA: Diagnosis not present

## 2023-07-02 ENCOUNTER — Other Ambulatory Visit (HOSPITAL_BASED_OUTPATIENT_CLINIC_OR_DEPARTMENT_OTHER): Payer: Self-pay | Admitting: Obstetrics & Gynecology

## 2023-07-06 ENCOUNTER — Ambulatory Visit: Payer: Medicare HMO | Admitting: Family Medicine

## 2023-07-12 NOTE — Progress Notes (Signed)
[facility] [address] [place] [address] Phone: [phone] Subjective:    I'm seeing this patient by the request  of:  Stamey, Verda Cumins, FNP  CC: Right hip pain  DGL:[id number]  [name] is a 72 y.o. female with hx of osteoporosis coming in with complaint of R hip pain. Patient states she was at the gym , she tried to move over a piece of a equipment with her leg and pulled her "butt muscle" this was about 2 months ago. She states it has kind of healed but still has some pain. No meds for the pain. Pain radiated down the leg when she first injured.   DEXA scan shows a -2.9 in May 2024     Past Medical History:  Diagnosis Date   Anxiety    Arthritis    Bursitis    Chronic constipation    takes magnesium and probiotic   Chronic migraine    neurology-- Britta Mccreedy beck PA ([facility])   DDD (degenerative disc disease), lumbar 2011   Moderate   Environmental and seasonal allergies    Hematoma of leg    due to trauma in her 81s   Hepatic cyst 2011   Multiple hepatic cysts, largest measuring 5 cm   Lumbar spondylosis    Osteoporosis    Thrombocytopenia (HCC)    chronic  (previously followed by dr Myna Hidalgo, released 2015)   Wears glasses    Past Surgical History:  Procedure Laterality Date   ANAL FISSURE REPAIR     BLEPHAROPLASTY Bilateral    BUNIONECTOMY Bilateral    CATARACT EXTRACTION, BILATERAL     feb & march 2021   COLONOSCOPY  last one 2011   DILATATION & CURETTAGE/HYSTEROSCOPY WITH MYOSURE N/A 05/05/2019   Procedure: DILATATION & CURETTAGE/HYSTEROSCOPY WITH MYOSURE RESECTION OF ENDOMETRIAL LESION;  Surgeon: Jerene Bears, MD;  Location: [facility];  Service: Gynecology;  Laterality: N/A;  endometrial polyp   HARDWARE REMOVAL  04/14/2012   Procedure: HARDWARE REMOVAL;  Surgeon: Sharma Covert, MD;  Location: [facility];  Service: Orthopedics;  Laterality: Left;  left wrist deep  hardware excision   ORIF WRIST FRACTURE Left 2011   THUMB SURGERY Right 04/17/2015   Social History   Socioeconomic History   Marital status: Married    Spouse name: Not on file   Number of children: Not on file   Years of education: Not on file   Highest education level: Not on file  Occupational History   Not on file  Tobacco Use   Smoking status: Former    Current packs/day: 0.00    Average packs/day: 0.5 packs/day for 7.0 years (3.5 ttl pk-yrs)    Types: Cigarettes    Start date: 10/21/1979    Quit date: 04/11/1986    Years since quitting: 37.2   Smokeless tobacco: Never  Vaping Use   Vaping status: Never Used  Substance and Sexual Activity   Alcohol use: No    Alcohol/week: 0.0 standard drinks of alcohol   Drug use: No   Sexual activity: Yes    Partners: Male    Birth control/protection: Post-menopausal  Other Topics Concern   Not on file  Social History Narrative   Not on file   Social Determinants of Health   Financial Resource Strain: Not on file  Food Insecurity: Not on file  Transportation Needs: Not on file  Physical Activity: Not on file  Stress: Not  on file  Social Connections: Unknown (04/27/2022)   Received from [facility]   Social Network    Social Network: Not on file   Allergies  Allergen Reactions   Corticosteroids Anxiety   Levonorgestrel-Ethinyl Estrad Itching   Cortisone    Other     Allergy shots caused thrombocyopenia   Latex Rash   Family History  Problem Relation Age of Onset   Diabetes Mother    Hypertension Mother    Stroke Mother    Dementia Mother    Heart disease Father    Cancer Father        lung cancer    Current Outpatient Medications (Endocrine & Metabolic):    alendronate (FOSAMAX) 70 MG tablet, TAKE 1 TABLET BY MOUTH ONCE A WEEK. TAKE WITH A FULL GLASS OF WATER ON AN EMPTY STOMACH.  Current Outpatient Medications (Cardiovascular):    rosuvastatin (CRESTOR) 5 MG tablet, 1 tablet  Current Outpatient  Medications (Respiratory):    promethazine (PHENERGAN) 12.5 MG tablet, Take 12.5 mg by mouth every 6 (six) hours as needed.    Current Outpatient Medications (Analgesics):    Fremanezumab-vfrm (AJOVY) 225 MG/1.5ML SOAJ, Inject into the skin.   SUMAtriptan (IMITREX) 100 MG tablet, TAKE 1 TABLET BY MOUTH FOR MIGRAINE.MAY REPEAT IN 2 HRS. NO MORE THAN 2TABS IN 24HRS OR 2-3DAYS/WK  Current Outpatient Medications (Hematological):    cyanocobalamin 1000 MCG tablet, Take by mouth.  Current Outpatient Medications (Other):    BIOTIN PO, Take by mouth.   Cholecalciferol (VITAMIN D3) 1000 UNITS CAPS, Take by mouth daily.   citalopram (CELEXA) 20 MG tablet, Take by mouth every morning.    clonazePAM (KLONOPIN) 1 MG tablet, Take 1 tablet (1 mg total) by mouth daily. (Patient taking differently: Take 1 mg by mouth every evening.)   Magnesium 500 MG TABS, Take by mouth daily.   zolpidem (AMBIEN) 10 MG tablet, 1 tablet at bedtime as needed   Reviewed prior external information including notes and imaging from  primary care provider As well as notes that were available from care everywhere and other healthcare systems.  Past medical history, social, surgical and family history all reviewed in electronic medical record.  No pertanent information unless stated regarding to the chief complaint.   Review of Systems:  No headache, visual changes, nausea, vomiting, diarrhea, constipation, dizziness, abdominal pain, skin rash, fevers, chills, night sweats, weight loss, swollen lymph nodes, body aches, joint swelling, chest pain, shortness of breath, mood changes. POSITIVE muscle aches  Objective  Blood pressure 120/68, pulse (!) 58, height 5' (1.524 m), weight 111 lb (50.3 kg), last menstrual period 12/14/1985, SpO2 99%.   General: No apparent distress alert and oriented x3 mood and affect normal, dressed appropriately.  HEENT: Pupils equal, extraocular movements intact  Respiratory: Patient's speak in full  sentences and does not appear short of breath  Cardiovascular: No lower extremity edema, non tender, no erythema   Right hip exam shows some limited range of motion with internal range of motion compared to the contralateral side.  Patient does have some tightness with Pearlean Brownie right greater than left.  Negative straight leg test noted.  Patient is severely tender to palpation over the greater trochanteric area right greater than left.  Mild discomfort over the sacroiliac joint.  [id number]; 15 additional minutes spent for Therapeutic exercises as stated in above notes.  This included exercises focusing on stretching, strengthening, with significant focus on eccentric aspects.   Long term goals include an improvement in range  of motion, strength, endurance as well as avoiding reinjury. Patient's frequency would include in 1-2 times a day, 3-5 times a week for a duration of 6-12 weeks. Hip strengthening exercises which included:  Pelvic tilt/bracing to help with proper recruitment of the lower abs and pelvic floor muscles  Glute strengthening to properly contract glutes without over-engaging low back and hamstrings - prone hip extension and glute bridge exercises Proper stretching techniques to increase effectiveness for the hip flexors, groin, quads, piriformic and low back when appropriate    Proper technique shown and discussed handout in great detail with ATC.  All questions were discussed and answered.      Impression and Recommendations:     The above documentation has been reviewed and is accurate and complete Judi Saa, DO

## 2023-07-13 ENCOUNTER — Other Ambulatory Visit: Payer: Self-pay

## 2023-07-13 ENCOUNTER — Ambulatory Visit (INDEPENDENT_AMBULATORY_CARE_PROVIDER_SITE_OTHER): Payer: Medicare HMO

## 2023-07-13 ENCOUNTER — Encounter: Payer: Self-pay | Admitting: Family Medicine

## 2023-07-13 ENCOUNTER — Ambulatory Visit (INDEPENDENT_AMBULATORY_CARE_PROVIDER_SITE_OTHER): Payer: Medicare HMO | Admitting: Family Medicine

## 2023-07-13 VITALS — BP 120/68 | HR 58 | Ht 60.0 in | Wt 111.0 lb

## 2023-07-13 DIAGNOSIS — M25551 Pain in right hip: Secondary | ICD-10-CM

## 2023-07-13 DIAGNOSIS — G5701 Lesion of sciatic nerve, right lower limb: Secondary | ICD-10-CM | POA: Insufficient documentation

## 2023-07-13 DIAGNOSIS — M1611 Unilateral primary osteoarthritis, right hip: Secondary | ICD-10-CM | POA: Diagnosis not present

## 2023-07-13 DIAGNOSIS — M47816 Spondylosis without myelopathy or radiculopathy, lumbar region: Secondary | ICD-10-CM | POA: Diagnosis not present

## 2023-07-13 DIAGNOSIS — M8580 Other specified disorders of bone density and structure, unspecified site: Secondary | ICD-10-CM | POA: Diagnosis not present

## 2023-07-13 NOTE — Patient Instructions (Signed)
Piriformis Xray hip today Arnica or voltaren  Ice before bed See me in 6-8 weeks

## 2023-07-13 NOTE — Assessment & Plan Note (Signed)
We discussed that patient does this as well as likely reactive bursitis on the side of the hip.  Will get x-rays to rule out any type of occult fracture with patient having difficulty with osteoporosis.  Discussed with patient about home exercises, icing regimen, hip abductor strengthening and work with athletic trainer to learn home exercises in greater detail.  Worsening pain can consider injection but patient did have a potential allergic reaction to steroids previously where patient was very forgetful.  Could consider the possibility of formal physical therapy.  Follow-up again in 2 months

## 2023-07-22 ENCOUNTER — Telehealth (HOSPITAL_BASED_OUTPATIENT_CLINIC_OR_DEPARTMENT_OTHER): Payer: Self-pay | Admitting: *Deleted

## 2023-07-22 NOTE — Telephone Encounter (Signed)
Called pt to let her know that insurance has approved Prolia injection. Pt would like to think about it before scheduling. She will call back when she is ready to proceed.

## 2023-07-28 DIAGNOSIS — G43009 Migraine without aura, not intractable, without status migrainosus: Secondary | ICD-10-CM | POA: Diagnosis not present

## 2023-07-28 DIAGNOSIS — M542 Cervicalgia: Secondary | ICD-10-CM | POA: Diagnosis not present

## 2023-08-18 DIAGNOSIS — R69 Illness, unspecified: Secondary | ICD-10-CM | POA: Diagnosis not present

## 2023-10-04 ENCOUNTER — Other Ambulatory Visit: Payer: Medicare HMO

## 2023-10-25 DIAGNOSIS — F5104 Psychophysiologic insomnia: Secondary | ICD-10-CM | POA: Diagnosis not present

## 2023-10-25 DIAGNOSIS — R339 Retention of urine, unspecified: Secondary | ICD-10-CM | POA: Diagnosis not present

## 2023-11-03 DIAGNOSIS — R69 Illness, unspecified: Secondary | ICD-10-CM | POA: Diagnosis not present

## 2023-12-13 DIAGNOSIS — J329 Chronic sinusitis, unspecified: Secondary | ICD-10-CM | POA: Diagnosis not present

## 2023-12-13 DIAGNOSIS — R0981 Nasal congestion: Secondary | ICD-10-CM | POA: Diagnosis not present

## 2023-12-13 DIAGNOSIS — J40 Bronchitis, not specified as acute or chronic: Secondary | ICD-10-CM | POA: Diagnosis not present

## 2023-12-13 DIAGNOSIS — R058 Other specified cough: Secondary | ICD-10-CM | POA: Diagnosis not present

## 2023-12-17 DIAGNOSIS — J029 Acute pharyngitis, unspecified: Secondary | ICD-10-CM | POA: Diagnosis not present

## 2023-12-17 DIAGNOSIS — J40 Bronchitis, not specified as acute or chronic: Secondary | ICD-10-CM | POA: Diagnosis not present

## 2023-12-17 DIAGNOSIS — J329 Chronic sinusitis, unspecified: Secondary | ICD-10-CM | POA: Diagnosis not present

## 2024-01-07 DIAGNOSIS — R0981 Nasal congestion: Secondary | ICD-10-CM | POA: Diagnosis not present

## 2024-01-07 DIAGNOSIS — R051 Acute cough: Secondary | ICD-10-CM | POA: Diagnosis not present

## 2024-01-07 DIAGNOSIS — J101 Influenza due to other identified influenza virus with other respiratory manifestations: Secondary | ICD-10-CM | POA: Diagnosis not present

## 2024-01-07 DIAGNOSIS — R509 Fever, unspecified: Secondary | ICD-10-CM | POA: Diagnosis not present

## 2024-01-14 DIAGNOSIS — J069 Acute upper respiratory infection, unspecified: Secondary | ICD-10-CM | POA: Diagnosis not present

## 2024-02-21 DIAGNOSIS — G43009 Migraine without aura, not intractable, without status migrainosus: Secondary | ICD-10-CM | POA: Diagnosis not present

## 2024-02-21 DIAGNOSIS — M5116 Intervertebral disc disorders with radiculopathy, lumbar region: Secondary | ICD-10-CM | POA: Diagnosis not present

## 2024-03-17 DIAGNOSIS — R29898 Other symptoms and signs involving the musculoskeletal system: Secondary | ICD-10-CM | POA: Diagnosis not present

## 2024-03-17 DIAGNOSIS — R198 Other specified symptoms and signs involving the digestive system and abdomen: Secondary | ICD-10-CM | POA: Diagnosis not present

## 2024-03-17 DIAGNOSIS — M5116 Intervertebral disc disorders with radiculopathy, lumbar region: Secondary | ICD-10-CM | POA: Diagnosis not present

## 2024-03-28 DIAGNOSIS — J069 Acute upper respiratory infection, unspecified: Secondary | ICD-10-CM | POA: Diagnosis not present

## 2024-04-03 DIAGNOSIS — M5116 Intervertebral disc disorders with radiculopathy, lumbar region: Secondary | ICD-10-CM | POA: Diagnosis not present

## 2024-04-03 DIAGNOSIS — R29898 Other symptoms and signs involving the musculoskeletal system: Secondary | ICD-10-CM | POA: Diagnosis not present

## 2024-04-03 DIAGNOSIS — R198 Other specified symptoms and signs involving the digestive system and abdomen: Secondary | ICD-10-CM | POA: Diagnosis not present

## 2024-05-22 DIAGNOSIS — E785 Hyperlipidemia, unspecified: Secondary | ICD-10-CM | POA: Diagnosis not present

## 2024-05-31 ENCOUNTER — Other Ambulatory Visit (HOSPITAL_BASED_OUTPATIENT_CLINIC_OR_DEPARTMENT_OTHER): Payer: Self-pay | Admitting: Family Medicine

## 2024-05-31 DIAGNOSIS — M810 Age-related osteoporosis without current pathological fracture: Secondary | ICD-10-CM

## 2024-06-10 ENCOUNTER — Other Ambulatory Visit (HOSPITAL_BASED_OUTPATIENT_CLINIC_OR_DEPARTMENT_OTHER): Payer: Self-pay | Admitting: Obstetrics & Gynecology

## 2024-06-11 DIAGNOSIS — U071 COVID-19: Secondary | ICD-10-CM | POA: Diagnosis not present

## 2024-06-11 DIAGNOSIS — J029 Acute pharyngitis, unspecified: Secondary | ICD-10-CM | POA: Diagnosis not present

## 2024-06-13 DIAGNOSIS — U071 COVID-19: Secondary | ICD-10-CM | POA: Diagnosis not present

## 2024-07-03 ENCOUNTER — Other Ambulatory Visit (HOSPITAL_BASED_OUTPATIENT_CLINIC_OR_DEPARTMENT_OTHER): Payer: Self-pay | Admitting: Obstetrics & Gynecology

## 2024-07-07 ENCOUNTER — Other Ambulatory Visit (HOSPITAL_BASED_OUTPATIENT_CLINIC_OR_DEPARTMENT_OTHER): Payer: Self-pay | Admitting: Obstetrics & Gynecology

## 2024-07-18 DIAGNOSIS — M810 Age-related osteoporosis without current pathological fracture: Secondary | ICD-10-CM | POA: Diagnosis not present

## 2024-08-02 ENCOUNTER — Other Ambulatory Visit (HOSPITAL_BASED_OUTPATIENT_CLINIC_OR_DEPARTMENT_OTHER): Payer: Self-pay | Admitting: Obstetrics & Gynecology

## 2024-08-07 ENCOUNTER — Other Ambulatory Visit (HOSPITAL_BASED_OUTPATIENT_CLINIC_OR_DEPARTMENT_OTHER): Payer: Self-pay | Admitting: Obstetrics & Gynecology

## 2024-08-07 DIAGNOSIS — Z1231 Encounter for screening mammogram for malignant neoplasm of breast: Secondary | ICD-10-CM

## 2024-08-09 DIAGNOSIS — M5116 Intervertebral disc disorders with radiculopathy, lumbar region: Secondary | ICD-10-CM | POA: Diagnosis not present

## 2024-08-09 DIAGNOSIS — M542 Cervicalgia: Secondary | ICD-10-CM | POA: Diagnosis not present

## 2024-08-09 DIAGNOSIS — G43009 Migraine without aura, not intractable, without status migrainosus: Secondary | ICD-10-CM | POA: Diagnosis not present

## 2024-09-15 ENCOUNTER — Encounter (HOSPITAL_BASED_OUTPATIENT_CLINIC_OR_DEPARTMENT_OTHER): Payer: Self-pay | Admitting: Radiology

## 2024-09-15 ENCOUNTER — Ambulatory Visit (HOSPITAL_BASED_OUTPATIENT_CLINIC_OR_DEPARTMENT_OTHER)
Admission: RE | Admit: 2024-09-15 | Discharge: 2024-09-15 | Disposition: A | Discharge status: Home / Self Care | Source: Ambulatory Visit | Attending: Obstetrics & Gynecology | Admitting: Obstetrics & Gynecology

## 2024-09-15 DIAGNOSIS — Z1231 Encounter for screening mammogram for malignant neoplasm of breast: Secondary | ICD-10-CM | POA: Insufficient documentation

## 2024-10-14 DIAGNOSIS — M4727 Other spondylosis with radiculopathy, lumbosacral region: Secondary | ICD-10-CM | POA: Diagnosis not present

## 2024-10-14 DIAGNOSIS — M5117 Intervertebral disc disorders with radiculopathy, lumbosacral region: Secondary | ICD-10-CM | POA: Diagnosis not present

## 2024-10-14 DIAGNOSIS — M4807 Spinal stenosis, lumbosacral region: Secondary | ICD-10-CM | POA: Diagnosis not present

## 2024-10-14 DIAGNOSIS — M4726 Other spondylosis with radiculopathy, lumbar region: Secondary | ICD-10-CM | POA: Diagnosis not present

## 2024-10-27 DIAGNOSIS — E785 Hyperlipidemia, unspecified: Secondary | ICD-10-CM | POA: Diagnosis not present

## 2024-10-27 DIAGNOSIS — F5104 Psychophysiologic insomnia: Secondary | ICD-10-CM | POA: Diagnosis not present

## 2024-10-27 DIAGNOSIS — R252 Cramp and spasm: Secondary | ICD-10-CM | POA: Diagnosis not present

## 2024-10-27 DIAGNOSIS — Z6820 Body mass index (BMI) 20.0-20.9, adult: Secondary | ICD-10-CM | POA: Diagnosis not present

## 2024-11-20 DIAGNOSIS — R339 Retention of urine, unspecified: Secondary | ICD-10-CM | POA: Diagnosis not present

## 2024-11-20 DIAGNOSIS — E785 Hyperlipidemia, unspecified: Secondary | ICD-10-CM | POA: Diagnosis not present

## 2024-11-24 DIAGNOSIS — M47817 Spondylosis without myelopathy or radiculopathy, lumbosacral region: Secondary | ICD-10-CM | POA: Diagnosis not present

## 2025-04-30 ENCOUNTER — Other Ambulatory Visit (HOSPITAL_BASED_OUTPATIENT_CLINIC_OR_DEPARTMENT_OTHER)
